# Patient Record
Sex: Female | Born: 1990 | Race: White | Hispanic: No | Marital: Single | Smoking: Never smoker
Health system: Southern US, Community
[De-identification: ages and names within clinical notes are randomized; demographics above are authoritative.]

## PROBLEM LIST (undated history)

## (undated) DIAGNOSIS — F419 Anxiety disorder, unspecified: Secondary | ICD-10-CM

---

## 2019-01-23 ENCOUNTER — Emergency Department (HOSPITAL_COMMUNITY): Payer: Self-pay

## 2019-01-23 ENCOUNTER — Observation Stay (HOSPITAL_COMMUNITY)
Admission: EM | Admit: 2019-01-23 | Discharge: 2019-01-25 | Disposition: A | Discharge status: Home / Self Care | Payer: Self-pay | Attending: Surgery | Admitting: Surgery

## 2019-01-23 ENCOUNTER — Encounter (HOSPITAL_COMMUNITY): Payer: Self-pay | Admitting: *Deleted

## 2019-01-23 ENCOUNTER — Other Ambulatory Visit: Payer: Self-pay

## 2019-01-23 DIAGNOSIS — K810 Acute cholecystitis: Secondary | ICD-10-CM | POA: Diagnosis present

## 2019-01-23 DIAGNOSIS — F419 Anxiety disorder, unspecified: Secondary | ICD-10-CM | POA: Insufficient documentation

## 2019-01-23 DIAGNOSIS — K819 Cholecystitis, unspecified: Secondary | ICD-10-CM

## 2019-01-23 DIAGNOSIS — Z419 Encounter for procedure for purposes other than remedying health state, unspecified: Secondary | ICD-10-CM

## 2019-01-23 DIAGNOSIS — K8000 Calculus of gallbladder with acute cholecystitis without obstruction: Principal | ICD-10-CM | POA: Insufficient documentation

## 2019-01-23 DIAGNOSIS — R109 Unspecified abdominal pain: Secondary | ICD-10-CM

## 2019-01-23 HISTORY — DX: Anxiety disorder, unspecified: F41.9

## 2019-01-23 LAB — COMPREHENSIVE METABOLIC PANEL
ALT: 16 U/L (ref 0–44)
AST: 21 U/L (ref 15–41)
Albumin: 3.9 g/dL (ref 3.5–5.0)
Alkaline Phosphatase: 59 U/L (ref 38–126)
Anion gap: 8 (ref 5–15)
BUN: 7 mg/dL (ref 6–20)
CO2: 24 mmol/L (ref 22–32)
Calcium: 9.1 mg/dL (ref 8.9–10.3)
Chloride: 108 mmol/L (ref 98–111)
Creatinine, Ser: 0.87 mg/dL (ref 0.44–1.00)
GFR calc Af Amer: >60 mL/min (ref >60)
GFR calc non Af Amer: >60 mL/min (ref >60)
Glucose, Bld: 131 mg/dL — ABNORMAL HIGH (ref 70–99)
Potassium: 4 mmol/L (ref 3.5–5.1)
Sodium: 140 mmol/L (ref 135–145)
Total Bilirubin: 0.8 mg/dL (ref 0.3–1.2)
Total Protein: 7.6 g/dL (ref 6.5–8.1)

## 2019-01-23 LAB — CBC
HCT: 41.6 % (ref 36.0–46.0)
Hemoglobin: 13.9 g/dL (ref 12.0–15.0)
MCH: 30.1 pg (ref 26.0–34.0)
MCHC: 33.4 g/dL (ref 30.0–36.0)
MCV: 90 fL (ref 80.0–100.0)
Platelets: 284 10*3/uL (ref 150–400)
RBC: 4.62 MIL/uL (ref 3.87–5.11)
RDW: 12 % (ref 11.5–15.5)
WBC: 12 10*3/uL — ABNORMAL HIGH (ref 4.0–10.5)
nRBC: 0 % (ref 0.0–0.2)

## 2019-01-23 LAB — URINALYSIS, ROUTINE W REFLEX MICROSCOPIC
Bilirubin Urine: NEGATIVE
Glucose, UA: NEGATIVE mg/dL
Ketones, ur: NEGATIVE mg/dL
Leukocytes,Ua: NEGATIVE
Nitrite: NEGATIVE
Protein, ur: NEGATIVE mg/dL
Specific Gravity, Urine: 1.017 (ref 1.005–1.030)
pH: 8 (ref 5.0–8.0)

## 2019-01-23 LAB — CBC WITH DIFFERENTIAL/PLATELET
Abs Immature Granulocytes: 0.06 10*3/uL (ref 0.00–0.07)
Basophils Absolute: 0 10*3/uL (ref 0.0–0.1)
Basophils Relative: 0 %
Eosinophils Absolute: 0 10*3/uL (ref 0.0–0.5)
Eosinophils Relative: 0 %
HCT: 40.7 % (ref 36.0–46.0)
Hemoglobin: 13.6 g/dL (ref 12.0–15.0)
Immature Granulocytes: 1 %
Lymphocytes Relative: 8 %
Lymphs Abs: 1 10*3/uL (ref 0.7–4.0)
MCH: 30.3 pg (ref 26.0–34.0)
MCHC: 33.4 g/dL (ref 30.0–36.0)
MCV: 90.6 fL (ref 80.0–100.0)
Monocytes Absolute: 0.4 10*3/uL (ref 0.1–1.0)
Monocytes Relative: 3 %
Neutro Abs: 10.5 10*3/uL — ABNORMAL HIGH (ref 1.7–7.7)
Neutrophils Relative %: 88 %
Platelets: 283 10*3/uL (ref 150–400)
RBC: 4.49 MIL/uL (ref 3.87–5.11)
RDW: 11.9 % (ref 11.5–15.5)
WBC: 12 10*3/uL — ABNORMAL HIGH (ref 4.0–10.5)
nRBC: 0 % (ref 0.0–0.2)

## 2019-01-23 LAB — CREATININE, SERUM
Creatinine, Ser: 0.78 mg/dL (ref 0.44–1.00)
GFR calc Af Amer: >60 mL/min (ref >60)
GFR calc non Af Amer: >60 mL/min (ref >60)

## 2019-01-23 LAB — I-STAT BETA HCG BLOOD, ED (MC, WL, AP ONLY): I-stat hCG, quantitative: <5 m[IU]/mL (ref <5)

## 2019-01-23 LAB — LIPASE, BLOOD: Lipase: 26 U/L (ref 11–51)

## 2019-01-23 LAB — SURGICAL PCR SCREEN
MRSA, PCR: NEGATIVE
Staphylococcus aureus: NEGATIVE

## 2019-01-23 MED ORDER — ACETAMINOPHEN 650 MG RE SUPP: 650.0000 mg | Freq: Four times a day (QID) | RECTAL | Status: DC | PRN

## 2019-01-23 MED ORDER — OXYCODONE HCL 5 MG PO TABS
5.0000 mg | ORAL_TABLET | ORAL | Status: DC | PRN
  Administered 2019-01-23 – 2019-01-25 (×3): 10 mg via ORAL
  Filled 2019-01-23 (×3): qty 2

## 2019-01-23 MED ORDER — KETOROLAC TROMETHAMINE 30 MG/ML IJ SOLN: 30.0000 mg | Freq: Four times a day (QID) | INTRAMUSCULAR | Status: DC | PRN

## 2019-01-23 MED ORDER — SODIUM CHLORIDE 0.9 % IV SOLN
2.0000 g | INTRAVENOUS | Status: DC
  Administered 2019-01-24: 2 g via INTRAVENOUS
  Filled 2019-01-23 (×2): qty 20

## 2019-01-23 MED ORDER — ALPRAZOLAM 0.25 MG PO TABS: 0.2500 mg | ORAL_TABLET | Freq: Two times a day (BID) | ORAL | Status: DC | PRN

## 2019-01-23 MED ORDER — SODIUM CHLORIDE 0.9 % IV SOLN
2.0000 g | Freq: Once | INTRAVENOUS | Status: AC
  Administered 2019-01-23: 16:00:00 2 g via INTRAVENOUS
  Filled 2019-01-23: qty 20

## 2019-01-23 MED ORDER — ESCITALOPRAM OXALATE 10 MG PO TABS
10.0000 mg | ORAL_TABLET | Freq: Every day | ORAL | Status: DC
  Administered 2019-01-23 – 2019-01-25 (×2): 10 mg via ORAL
  Filled 2019-01-23 (×2): qty 1

## 2019-01-23 MED ORDER — HYDROMORPHONE HCL 1 MG/ML IJ SOLN
0.5000 mg | Freq: Once | INTRAMUSCULAR | Status: AC
  Administered 2019-01-23: 0.5 mg via INTRAVENOUS
  Filled 2019-01-23: qty 1

## 2019-01-23 MED ORDER — ONDANSETRON HCL 4 MG/2ML IJ SOLN: 4.0000 mg | Freq: Four times a day (QID) | INTRAMUSCULAR | Status: DC | PRN

## 2019-01-23 MED ORDER — ONDANSETRON HCL 4 MG/2ML IJ SOLN
4.0000 mg | Freq: Once | INTRAMUSCULAR | Status: AC
  Administered 2019-01-23: 4 mg via INTRAVENOUS
  Filled 2019-01-23: qty 2

## 2019-01-23 MED ORDER — ONDANSETRON 4 MG PO TBDP: 4.0000 mg | ORAL_TABLET | Freq: Four times a day (QID) | ORAL | Status: DC | PRN

## 2019-01-23 MED ORDER — ACETAMINOPHEN 325 MG PO TABS: 650.0000 mg | ORAL_TABLET | Freq: Four times a day (QID) | ORAL | Status: DC | PRN

## 2019-01-23 MED ORDER — DIPHENHYDRAMINE HCL 50 MG/ML IJ SOLN: 25.0000 mg | Freq: Four times a day (QID) | INTRAMUSCULAR | Status: DC | PRN

## 2019-01-23 MED ORDER — SODIUM CHLORIDE 0.9 % IV SOLN
INTRAVENOUS | Status: DC
  Administered 2019-01-23 (×2): via INTRAVENOUS

## 2019-01-23 MED ORDER — MORPHINE SULFATE (PF) 4 MG/ML IV SOLN
4.0000 mg | Freq: Once | INTRAVENOUS | Status: AC
  Administered 2019-01-23: 4 mg via INTRAVENOUS
  Filled 2019-01-23: qty 1

## 2019-01-23 MED ORDER — DIPHENHYDRAMINE HCL 25 MG PO CAPS: 25.0000 mg | ORAL_CAPSULE | Freq: Four times a day (QID) | ORAL | Status: DC | PRN

## 2019-01-23 MED ORDER — ENOXAPARIN SODIUM 40 MG/0.4ML ~~LOC~~ SOLN
40.0000 mg | SUBCUTANEOUS | Status: DC
  Administered 2019-01-24: 40 mg via SUBCUTANEOUS
  Filled 2019-01-23: qty 0.4

## 2019-01-23 MED ORDER — SODIUM CHLORIDE 0.9 % IV BOLUS
1000.0000 mL | Freq: Once | INTRAVENOUS | Status: AC
  Administered 2019-01-23: 1000 mL via INTRAVENOUS

## 2019-01-23 MED ORDER — METOPROLOL TARTRATE 5 MG/5ML IV SOLN: 5.0000 mg | Freq: Four times a day (QID) | INTRAVENOUS | Status: DC | PRN

## 2019-01-23 MED ORDER — HYDROMORPHONE HCL 1 MG/ML IJ SOLN: 1.0000 mg | INTRAMUSCULAR | Status: DC | PRN

## 2019-01-23 MED ORDER — IOHEXOL 300 MG/ML  SOLN
100.0000 mL | Freq: Once | INTRAMUSCULAR | Status: AC | PRN
  Administered 2019-01-23: 100 mL via INTRAVENOUS

## 2019-01-23 MED ORDER — HYDROMORPHONE HCL 1 MG/ML IJ SOLN
1.0000 mg | Freq: Once | INTRAMUSCULAR | Status: AC
  Administered 2019-01-23: 1 mg via INTRAVENOUS
  Filled 2019-01-23: qty 1

## 2019-01-23 NOTE — ED Provider Notes (Signed)
[facility] EMERGENCY DEPARTMENT Provider Note   CSN: [id number] Arrival date & time: 01/23/19  1115  History   Chief Complaint Abdominal pain  HPI Beth [name] is a 28 y.o. female with past medical history who presents for evaluation of abdominal pain.  Patient states she woke at 5 AM this morning with gradually worsening, severe abdominal pain.  Pain is located to her periumbilical area.  Rates her pain a 10/10.  States pain is been constant onset.  She denies prior abdominal surgeries.  Patient states she has had persistent nausea as well as one episode of nonbloody, nonbilious emesis.  She denies any suspicious food intake, known sick contacts, antibiotic use.  Patient has not taken anything for her pain.  Denies fever, chills, neck pain, neck stiffness, headache, cough, chest pain, shortness of breath, diarrhea, dysuria, constipation, pelvic pain, vaginal bleeding, vaginal discharge.  LMP 01/23/2019.  Patient denies chance of current pregnancy.  Describes her pain as a aching and tightening sensation.  Denies additional aggravating or alleviating factors. Denies radiation of pain into back or shoulder. No NSAID use, tobacco or alcohol use. Patient states that does have a history of gallstones, however that pain "feels different." Patient was seen by UC earlier today and referred to ED for labs and imaging.  Prior ABD surgery-- None Last PO intake 10 pm yesterday  History obtained from patient.  No interpreter was used.     HPI  Past Medical History:  Diagnosis Date  . Anxiety     There are no active problems to display for this patient.   History reviewed. No pertinent surgical history.   OB History   No obstetric history on file.      Home Medications    Prior to Admission medications   Medication Sig Start Date End Date Taking? Authorizing Provider  escitalopram (LEXAPRO) 10 MG tablet Take 10 mg by mouth daily. 01/03/19   [provider]     Family History History reviewed. No pertinent family history.  Social History Social History   Tobacco Use  . Smoking status: Never Smoker  . Smokeless tobacco: Never Used  . Tobacco comment: [place]  Substance Use Topics  . Alcohol use: Yes    Comment: social  . Drug use: Never     Allergies   Patient has no known allergies.   Review of Systems Review of Systems  Constitutional: Negative.   HENT: Negative.   Respiratory: Negative.   Cardiovascular: Negative.   Gastrointestinal: Positive for abdominal pain, nausea and vomiting. Negative for anal bleeding, blood in stool, constipation, diarrhea and rectal pain.  Genitourinary: Negative.   Musculoskeletal: Negative.   Neurological: Negative.   All other systems reviewed and are negative.    Physical Exam Updated Vital Signs BP 140/82 (BP Location: Right Arm)   Pulse 67   Temp 98.5 F (36.9 C) (Oral)   Resp 16   Ht  (1.803 m)   LMP 01/23/2019   SpO2 100%   Physical Exam Vitals signs and nursing note reviewed.  Constitutional:      General: She is not in acute distress.    Appearance: She is well-developed. She is not ill-appearing, toxic-appearing or diaphoretic.  HENT:     Head: Normocephalic and atraumatic.     Mouth/Throat:     Comments: Posterior oropharynx clear.  Mucous membranes moist. Eyes:     Pupils: Pupils are equal, round, and reactive to light.     Comments: No scleral  icterus.  Pupils equals equal reactive to light.  Neck:     Musculoskeletal: Normal range of motion.  Cardiovascular:     Rate and Rhythm: Normal rate.     Heart sounds: Normal heart sounds.  Pulmonary:     Effort: No respiratory distress.     Comments: Clear to auscultation bilaterally without wheeze, rhonchi or rales.  No accessory muscle usage.  Speaking full sentences without difficulty. Abdominal:     General: There is no distension.     Comments: Soft without rebound.  She has generalized tenderness palpation with  guarding. She has normoactive bowel sounds.  Appears in discomfort.  Negative CVA tap bilaterally. Positive Murphy sign on reevaluation. She has no abdominal wall skin changes or obvious herniations.  Musculoskeletal: Normal range of motion.     Comments: Moves all 4 extremities without difficulty.  2+ radial as well as 2+ DP pulses bilaterally.  Skin:    General: Skin is warm and dry.     Comments: No edema, erythema, ecchymosis or warmth.  No jaundice to skin.  Brisk capillary refill.  Neurological:     Mental Status: She is alert.     Comments: Cranial nerves II through XII grossly intact.  No dysphasia.  Ambulatory in department without difficulty.    ED Treatments / Results  Labs (all labs ordered are listed, but only abnormal results are displayed) Labs Reviewed  CBC WITH DIFFERENTIAL/PLATELET - Abnormal; Notable for the following components:      Result Value   WBC 12.0 (*)    Neutro Abs 10.5 (*)    All other components within normal limits  COMPREHENSIVE METABOLIC PANEL - Abnormal; Notable for the following components:   Glucose, Bld 131 (*)    All other components within normal limits  URINALYSIS, ROUTINE W REFLEX MICROSCOPIC - Abnormal; Notable for the following components:   APPearance CLOUDY (*)    Hgb urine dipstick SMALL (*)    Bacteria, UA RARE (*)    All other components within normal limits  URINE CULTURE  LIPASE, BLOOD  I-STAT BETA HCG BLOOD, ED ([facility], [facility], [facility] ONLY)    EKG None  Radiology Ct Abdomen Pelvis W Contrast  Result Date: 01/23/2019 CLINICAL DATA:  Abdominal pain EXAM: CT ABDOMEN AND PELVIS WITH CONTRAST TECHNIQUE: Multidetector CT imaging of the abdomen and pelvis was performed using the standard protocol following bolus administration of intravenous contrast. CONTRAST:  OMNIPAQUE IOHEXOL 300 MG/ML  SOLN COMPARISON:  None. FINDINGS: Lower chest: On axial slice 1 series 4, there is a 2 mm nodular opacity in the right middle lobe. Lung bases  otherwise are clear. Hepatobiliary: No focal liver lesions are appreciable. There are nitrogen containing gallstones in the gallbladder. The gallbladder wall appears mildly edematous by CT. There is no biliary duct dilatation. Pancreas: There is no pancreatic mass or inflammatory focus. Spleen: No splenic lesions are evident. Adrenals/Urinary Tract: Adrenals bilaterally appear normal. Kidneys bilaterally show no evident mass or hydronephrosis on either side. There is no renal or ureteral calculus on either side. Urinary bladder is midline with wall thickness within normal limits. Stomach/Bowel: There is no appreciable bowel wall or mesenteric thickening. There is no appreciable bowel obstruction. There is no free air or portal venous air. Vascular/Lymphatic: There is no abdominal aortic aneurysm. No vascular lesions are evident. There is no adenopathy in the abdomen or pelvis. Reproductive: The uterus is anteverted. There is no evident pelvic mass. Other: The appendix appears normal. There is no abscess  or ascites in the abdomen or pelvis. Musculoskeletal: No blastic or lytic bone lesions are evident. There is no intramuscular or abdominal wall lesion evident. IMPRESSION: 1. Nitrogen containing gallstones in the gallbladder. The gallbladder wall appears mildly edematous by CT. Advise correlation with ultrasound of the gallbladder to further assess for potential changes of early acute cholecystitis. 2. No evident bowel obstruction. No abscess in the abdomen or pelvis. Appendix appears normal. 3. No renal or ureteral calculus. No hydronephrosis. Urinary bladder wall thickness is normal. 4. 2 mm nodular opacity in the right middle lobe. This finding is almost certainly benign in this age group. Unless patient has a history of underlying neoplasm, no further imaging of this small nodular lesion is felt to be warranted. Electronically Signed   By: Bretta Bang III M.D.   On: 01/23/2019 13:51    US Abdomen Limited  Ruq  Result Date: 01/23/2019 CLINICAL DATA:  Upper abdominal pain EXAM: ULTRASOUND ABDOMEN LIMITED RIGHT UPPER QUADRANT COMPARISON:  CT abdomen and pelvis January 23, 2019 FINDINGS: Gallbladder: Within the gallbladder, there are echogenic foci which move and shadow consistent with cholelithiasis. Largest gallstone measures 1.8 cm in length. There is thickening of the gallbladder wall with subtle wall edema. No pericholecystic fluid. Patient is focally tender over the gallbladder. Common bile duct: Diameter: 3 mm. No intrahepatic or extrahepatic biliary duct dilatation. Liver: No focal lesion identified. Within normal limits in parenchymal echogenicity. Portal vein is patent on color Doppler imaging with normal direction of blood flow towards the liver. IMPRESSION: Cholelithiasis. Gallbladder wall is mildly thickened and subtly edematous. Patient is tender focally over the gallbladder. These are findings concerning for a degree of acute cholecystitis. Study otherwise unremarkable. Electronically Signed   By: Bretta Bang III M.D.   On: 01/23/2019 14:44   Procedures Procedures (including critical care time)  Medications Ordered in ED Medications  cefTRIAXone (ROCEPHIN) 2 g in sodium chloride 0.9 % 100 mL IVPB (has no administration in time range)  sodium chloride 0.9 % bolus 1,000 mL (1,000 mLs Intravenous New Bag/Given 01/23/19 1144)  ondansetron (ZOFRAN) injection 4 mg (4 mg Intravenous Given 01/23/19 1143)  morphine 4 MG/ML injection 4 mg (4 mg Intravenous Given 01/23/19 1145)  HYDROmorphone (DILAUDID) injection 1 mg (1 mg Intravenous Given 01/23/19 1322)  iohexol (OMNIPAQUE) 300 MG/ML solution 100 mL (100 mLs Intravenous Contrast Given 01/23/19 1332)   Initial Impression / Assessment and Plan / ED Course  I have reviewed the triage vital signs and the nursing notes.  Pertinent labs & imaging results that were available during my care of the patient were reviewed by me and considered in my medical  decision making (see chart for details).  28 year old female appears otherwise well presents for evaluation of abdominal pain.  Afebrile, nonseptic, non-ill-appearing.  Patient with gradual onset worsening generalized abdominal pain worse around periumbilical region.  Pain began at 5 AM, 6 hours PTA.  Has not taking anything for her symptoms.  Persistent nausea as well as one episode of nonbloody, nonbilious emesis.  She has been having normal bowel movements.  No urinary symptoms.  Denies concerns for STDs, no pelvic pain or vaginal discharge.  LMP 01/23/2019.  Suspicious food intake, sick contacts or recent antibiotic use.  Her abdominal pain does not radiate into her shoulder or into her back.  Not exacerbated with food intake.  Is not anything like this previously.  On exam abdomen soft without rebound.  She does have generalized tenderness to palpation with guarding.  Normoactive bowel sounds.  No abdominal wall skin changes or obvious herniations.  Membranes moist.  Lungs clear.  Will obtain labs, IV fluids, urinalysis, antiemetics, pain medication, imaging and reevaluate.  1310: Patient requesting additional pain medication. Only mild relief with Morphine. Will order Dilaudid.  Labs and imaging personally reviewed:  CBC with mild leukocytosis at 12.0, hemoglobin 13.6 Metabolic panel with mild hyperglycemia at 131, no additional electrolyte, renal or liver abnormalities Urinalysis negative for infection she does have blood in her urine, however she her cycle, will culture HCG negative Lipase:26  1415: Patient with no current pain over tender over gallbladder. Sleeping on exam arousable by voice. CT with Nitrogen containing gallstones in the gallbladder. The gallbladder wall appears mildly edematous by CT. Advise correlation with ultrasound of the gallbladder to further assess for potential changes of early acute cholecystitis. Will order US and reevaluate.  US with gallbladder wall thickening and  subtle edema and tenderness over gallbladder suggestive of degree of cholecystitis. Will give Rocephin and consult with general surgery.  1450: General surgery consult pending. ABX ordered.  1500: Consulted with Wells GuilesKelly Rayburn PA with general surgery who will evaluate patient in ED for admission. Patient hemodynamically stable. I discussed plan with patient.  Patient agreement with plan.      Final Clinical Impressions(s) / ED Diagnoses   Final diagnoses:  Abdominal pain  Cholecystitis    ED Discharge Orders    None       Ceceilia Cephus A, PA-C 01/23/19 1517    Tilden Fossaees, Elizabeth, MD 01/24/19 [phone]

## 2019-01-23 NOTE — ED Notes (Signed)
Dr Margaree Mackintosh in to speak to pt

## 2019-01-23 NOTE — Plan of Care (Signed)
  Problem: Pain Managment: Goal: General experience of comfort will improve Outcome: Progressing   

## 2019-01-23 NOTE — ED Notes (Signed)
Pt back form ultrasound.

## 2019-01-23 NOTE — H&P (Signed)
[facility] Admission Note  Beth [name] 09-17-1991  [id number].    Requesting MD: Madilyn Hook Chief Complaint/Reason for Consult: cholecystitis HPI:  Patient is an otherwise healthy 28 year old female with known gallstones who presented to [facility] today with abdominal pain. Patient reports pain woke her from sleep this AM and has been more severe than past gallbladder attacks. Pain primarily located in epigastric region of abdomen. Associated nausea with bilious emesis. Patient denies fever, chills, chest pain, SOB, diarrhea, constipation, urinary symptoms. Patient takes medication for anxiety daily, but no blood thinning medications. Patient has never had any surgery. Occasional alcohol use, smokes hookah, denies illicit drug use.   ROS: Review of Systems  Constitutional: Negative for chills and fever.  Respiratory: Negative for shortness of breath and wheezing.   Cardiovascular: Negative for chest pain and palpitations.  Gastrointestinal: Positive for abdominal pain, nausea and vomiting. Negative for blood in stool, constipation, diarrhea and melena.  Genitourinary: Negative for dysuria, frequency and urgency.  Psychiatric/Behavioral: The patient is nervous/anxious.   All other systems reviewed and are negative.   History reviewed. No pertinent family history.  Past Medical History:  Diagnosis Date  . Anxiety     History reviewed. No pertinent surgical history.  Social History:  reports that she has never smoked. She has never used smokeless tobacco. She reports current alcohol use. She reports that she does not use drugs.  Allergies: No Known Allergies  (Not in a hospital admission)   Blood pressure 140/82, pulse 67, temperature 98.5 F (36.9 C), temperature source Oral, resp. rate 16, height  (1.803 m), last menstrual period 01/23/2019, SpO2 100 %. Physical Exam: Physical Exam Constitutional:      General: She is not in acute distress.    Appearance: She is  well-developed and overweight. She is not toxic-appearing.  HENT:     Head: Normocephalic and atraumatic.     Right Ear: External ear normal.     Left Ear: External ear normal.     Nose: Nose normal.     Mouth/Throat:     Lips: Pink.     Mouth: Mucous membranes are moist.  Eyes:     General: Lids are normal. No scleral icterus. Neck:     Musculoskeletal: Normal range of motion and neck supple.  Cardiovascular:     Rate and Rhythm: Normal rate and regular rhythm.     Pulses:          Radial pulses are 2+ on the right side and 2+ on the left side.  Pulmonary:     Effort: Pulmonary effort is normal.     Breath sounds: Normal breath sounds.  Abdominal:     General: Bowel sounds are normal. There is no distension.     Palpations: Abdomen is soft. There is no hepatomegaly or splenomegaly.     Tenderness: There is abdominal tenderness in the right upper quadrant and epigastric area. There is no guarding or rebound. Positive signs include Murphy's sign.  Musculoskeletal:     Comments: ROM grossly intact in all 4 extremities  Skin:    General: Skin is warm and dry.     Coloration: Skin is not jaundiced.  Neurological:     Mental Status: She is alert and oriented to person, place, and time.  Psychiatric:        Attention and Perception: Attention and perception normal.        Mood and Affect: Mood is anxious.  Speech: Speech normal.        Behavior: Behavior normal. Behavior is cooperative.     Results for orders placed or performed during the hospital encounter of 01/23/19 (from the past 48 hour(s))  CBC with Differential     Status: Abnormal   Collection Time: 01/23/19 11:26 AM  Result Value Ref Range   WBC 12.0 (H) 4.0 - 10.5 K/uL   RBC 4.49 3.87 - 5.11 MIL/uL   Hemoglobin 13.6 12.0 - 15.0 g/dL   HCT 69.640.7 29.536.0 - 28.446.0 %   MCV 90.6 80.0 - 100.0 fL   MCH 30.3 26.0 - 34.0 pg   MCHC 33.4 30.0 - 36.0 g/dL   RDW 13.211.9 44.011.5 - 10.215.5 %   Platelets 283 150 - 400 K/uL   nRBC 0.0  0.0 - 0.2 %   Neutrophils Relative % 88 %   Neutro Abs 10.5 (H) 1.7 - 7.7 K/uL   Lymphocytes Relative 8 %   Lymphs Abs 1.0 0.7 - 4.0 K/uL   Monocytes Relative 3 %   Monocytes Absolute 0.4 0.1 - 1.0 K/uL   Eosinophils Relative 0 %   Eosinophils Absolute 0.0 0.0 - 0.5 K/uL   Basophils Relative 0 %   Basophils Absolute 0.0 0.0 - 0.1 K/uL   Immature Granulocytes 1 %   Abs Immature Granulocytes 0.06 0.00 - 0.07 K/uL    Comment: Performed at [facility], [address] [place], [place] [address]  Comprehensive metabolic panel     Status: Abnormal   Collection Time: 01/23/19 11:26 AM  Result Value Ref Range   Sodium 140 135 - 145 mmol/L   Potassium 4.0 3.5 - 5.1 mmol/L   Chloride 108 98 - 111 mmol/L   CO2 24 22 - 32 mmol/L   Glucose, Bld 131 (H) 70 - 99 mg/dL   BUN 7 6 - 20 mg/dL   Creatinine, Ser 6.440.87 0.44 - 1.00 mg/dL   Calcium 9.1 8.9 - 03.410.3 mg/dL   Total Protein 7.6 6.5 - 8.1 g/dL   Albumin 3.9 3.5 - 5.0 g/dL   AST 21 15 - 41 U/L   ALT 16 0 - 44 U/L   Alkaline Phosphatase 59 38 - 126 U/L   Total Bilirubin 0.8 0.3 - 1.2 mg/dL   GFR calc non Af Amer >60 >60 mL/min   GFR calc Af Amer >60 >60 mL/min   Anion gap 8 5 - 15    Comment: Performed at [facility], [address] [place], [place] [address]  Lipase, blood     Status: None   Collection Time: 01/23/19 11:26 AM  Result Value Ref Range   Lipase 26 11 - 51 U/L    Comment: Performed at [facility], [address] [place], [place] [address]  Urinalysis, Routine w reflex microscopic     Status: Abnormal   Collection Time: 01/23/19 11:26 AM  Result Value Ref Range   Color, Urine YELLOW YELLOW   APPearance CLOUDY (A) CLEAR   Specific Gravity, Urine 1.017 1.005 - 1.030   pH 8.0 5.0 - 8.0   Glucose, UA NEGATIVE NEGATIVE mg/dL   Hgb urine dipstick SMALL (A) NEGATIVE   Bilirubin Urine NEGATIVE NEGATIVE   Ketones, ur NEGATIVE NEGATIVE mg/dL   Protein, ur NEGATIVE NEGATIVE mg/dL   Nitrite NEGATIVE NEGATIVE    Leukocytes,Ua NEGATIVE NEGATIVE   RBC / HPF 0-5 0 - 5 RBC/hpf   WBC, UA 0-5 0 - 5 WBC/hpf  Bacteria, UA RARE (A) NONE SEEN   Squamous Epithelial / LPF 0-5 0 - 5   Mucus PRESENT     Comment: Performed at [facility], [address] [place], [place] [address]  I-Stat Beta hCG blood, ED ([facility], [facility], [facility] only)     Status: None   Collection Time: 01/23/19 12:03 PM  Result Value Ref Range   I-stat hCG, quantitative <5.0 <5 mIU/mL   Comment 3            Comment:   GEST. AGE      CONC.  (mIU/mL)   <=1 WEEK        5 - 50     2 WEEKS       50 - 500     3 WEEKS       100 - 10,000     4 WEEKS     1,000 - 30,000        FEMALE AND NON-PREGNANT FEMALE:     LESS THAN 5 mIU/mL    Ct Abdomen Pelvis W Contrast  Result Date: 01/23/2019 CLINICAL DATA:  Abdominal pain EXAM: CT ABDOMEN AND PELVIS WITH CONTRAST TECHNIQUE: Multidetector CT imaging of the abdomen and pelvis was performed using the standard protocol following bolus administration of intravenous contrast. CONTRAST:  OMNIPAQUE IOHEXOL 300 MG/ML  SOLN COMPARISON:  None. FINDINGS: Lower chest: On axial slice 1 series 4, there is a 2 mm nodular opacity in the right middle lobe. Lung bases otherwise are clear. Hepatobiliary: No focal liver lesions are appreciable. There are nitrogen containing gallstones in the gallbladder. The gallbladder wall appears mildly edematous by CT. There is no biliary duct dilatation. Pancreas: There is no pancreatic mass or inflammatory focus. Spleen: No splenic lesions are evident. Adrenals/Urinary Tract: Adrenals bilaterally appear normal. Kidneys bilaterally show no evident mass or hydronephrosis on either side. There is no renal or ureteral calculus on either side. Urinary bladder is midline with wall thickness within normal limits. Stomach/Bowel: There is no appreciable bowel wall or mesenteric thickening. There is no appreciable bowel obstruction. There is no free air or portal venous air. Vascular/Lymphatic:  There is no abdominal aortic aneurysm. No vascular lesions are evident. There is no adenopathy in the abdomen or pelvis. Reproductive: The uterus is anteverted. There is no evident pelvic mass. Other: The appendix appears normal. There is no abscess or ascites in the abdomen or pelvis. Musculoskeletal: No blastic or lytic bone lesions are evident. There is no intramuscular or abdominal wall lesion evident. IMPRESSION: 1. Nitrogen containing gallstones in the gallbladder. The gallbladder wall appears mildly edematous by CT. Advise correlation with ultrasound of the gallbladder to further assess for potential changes of early acute cholecystitis. 2. No evident bowel obstruction. No abscess in the abdomen or pelvis. Appendix appears normal. 3. No renal or ureteral calculus. No hydronephrosis. Urinary bladder wall thickness is normal. 4. 2 mm nodular opacity in the right middle lobe. This finding is almost certainly benign in this age group. Unless patient has a history of underlying neoplasm, no further imaging of this small nodular lesion is felt to be warranted. Electronically Signed   By: Bretta Bang III M.D.   On: 01/23/2019 13:51   US Abdomen Limited Ruq  Result Date: 01/23/2019 CLINICAL DATA:  Upper abdominal pain EXAM: ULTRASOUND ABDOMEN LIMITED RIGHT UPPER QUADRANT COMPARISON:  CT abdomen and pelvis January 23, 2019 FINDINGS: Gallbladder: Within the gallbladder, there are echogenic foci which move and shadow consistent with cholelithiasis.  Largest gallstone measures 1.8 cm in length. There is thickening of the gallbladder wall with subtle wall edema. No pericholecystic fluid. Patient is focally tender over the gallbladder. Common bile duct: Diameter: 3 mm. No intrahepatic or extrahepatic biliary duct dilatation. Liver: No focal lesion identified. Within normal limits in parenchymal echogenicity. Portal vein is patent on color Doppler imaging with normal direction of blood flow towards the liver.  IMPRESSION: Cholelithiasis. Gallbladder wall is mildly thickened and subtly edematous. Patient is tender focally over the gallbladder. These are findings concerning for a degree of acute cholecystitis. Study otherwise unremarkable. Electronically Signed   By: Bretta Bang III M.D.   On: 01/23/2019 14:44      Assessment/Plan Acute Cholecystitis - admit to floor - IVF and IV abx - ok to have CLD, NPO after MN - plan lap chole tomorrow AM  Wells Guiles, [facility] 01/23/2019, 3:35 PM Pager: [phone] Consults: [phone]

## 2019-01-23 NOTE — ED Triage Notes (Signed)
Pt reports ABD woke her up this morning. Pain per pt. 10/10 . Pt reports nausea  No vomiting.

## 2019-01-24 ENCOUNTER — Observation Stay (HOSPITAL_COMMUNITY): Payer: Self-pay

## 2019-01-24 ENCOUNTER — Observation Stay (HOSPITAL_COMMUNITY): Payer: Self-pay | Admitting: Anesthesiology

## 2019-01-24 ENCOUNTER — Encounter (HOSPITAL_COMMUNITY)
Admission: EM | Disposition: A | Discharge status: Home / Self Care | Payer: Self-pay | Source: Home / Self Care | Attending: Emergency Medicine

## 2019-01-24 HISTORY — PX: CHOLECYSTECTOMY: SHX55

## 2019-01-24 LAB — CBC
HCT: 37.9 % (ref 36.0–46.0)
Hemoglobin: 12.6 g/dL (ref 12.0–15.0)
MCH: 30.1 pg (ref 26.0–34.0)
MCHC: 33.2 g/dL (ref 30.0–36.0)
MCV: 90.5 fL (ref 80.0–100.0)
Platelets: 249 10*3/uL (ref 150–400)
RBC: 4.19 MIL/uL (ref 3.87–5.11)
RDW: 12.1 % (ref 11.5–15.5)
WBC: 9 10*3/uL (ref 4.0–10.5)
nRBC: 0 % (ref 0.0–0.2)

## 2019-01-24 LAB — COMPREHENSIVE METABOLIC PANEL
ALT: 37 U/L (ref 0–44)
AST: 49 U/L — ABNORMAL HIGH (ref 15–41)
Albumin: 3.2 g/dL — ABNORMAL LOW (ref 3.5–5.0)
Alkaline Phosphatase: 54 U/L (ref 38–126)
Anion gap: 9 (ref 5–15)
BUN: 7 mg/dL (ref 6–20)
CO2: 21 mmol/L — ABNORMAL LOW (ref 22–32)
Calcium: 8.4 mg/dL — ABNORMAL LOW (ref 8.9–10.3)
Chloride: 108 mmol/L (ref 98–111)
Creatinine, Ser: 0.81 mg/dL (ref 0.44–1.00)
GFR calc Af Amer: >60 mL/min (ref >60)
GFR calc non Af Amer: >60 mL/min (ref >60)
Glucose, Bld: 104 mg/dL — ABNORMAL HIGH (ref 70–99)
Potassium: 3.4 mmol/L — ABNORMAL LOW (ref 3.5–5.1)
Sodium: 138 mmol/L (ref 135–145)
Total Bilirubin: 1.2 mg/dL (ref 0.3–1.2)
Total Protein: 6.7 g/dL (ref 6.5–8.1)

## 2019-01-24 SURGERY — LAPAROSCOPIC CHOLECYSTECTOMY WITH INTRAOPERATIVE CHOLANGIOGRAM
Anesthesia: General | Site: Abdomen | Laterality: Left

## 2019-01-24 MED ORDER — LACTATED RINGERS IV SOLN
INTRAVENOUS | Status: DC
  Administered 2019-01-24: 09:00:00 via INTRAVENOUS

## 2019-01-24 MED ORDER — PROMETHAZINE HCL 25 MG/ML IJ SOLN: 6.2500 mg | INTRAMUSCULAR | Status: DC | PRN

## 2019-01-24 MED ORDER — LIDOCAINE HCL (CARDIAC) PF 100 MG/5ML IV SOSY
PREFILLED_SYRINGE | INTRAVENOUS | Status: DC | PRN
  Administered 2019-01-24: 80 mg via INTRAVENOUS

## 2019-01-24 MED ORDER — ACETAMINOPHEN 10 MG/ML IV SOLN
1000.0000 mg | Freq: Once | INTRAVENOUS | Status: AC
  Administered 2019-01-24: 11:00:00 1000 mg via INTRAVENOUS

## 2019-01-24 MED ORDER — FENTANYL CITRATE (PF) 250 MCG/5ML IJ SOLN
INTRAMUSCULAR | Status: AC
  Filled 2019-01-24: qty 5

## 2019-01-24 MED ORDER — ONDANSETRON HCL 4 MG/2ML IJ SOLN
INTRAMUSCULAR | Status: DC | PRN
  Administered 2019-01-24: 4 mg via INTRAVENOUS

## 2019-01-24 MED ORDER — FENTANYL CITRATE (PF) 250 MCG/5ML IJ SOLN
INTRAMUSCULAR | Status: DC | PRN
  Administered 2019-01-24: 100 ug via INTRAVENOUS
  Administered 2019-01-24 (×5): 50 ug via INTRAVENOUS

## 2019-01-24 MED ORDER — ROCURONIUM BROMIDE 50 MG/5ML IV SOSY
PREFILLED_SYRINGE | INTRAVENOUS | Status: AC
  Filled 2019-01-24: qty 5

## 2019-01-24 MED ORDER — MIDAZOLAM HCL 2 MG/2ML IJ SOLN
INTRAMUSCULAR | Status: AC
  Filled 2019-01-24: qty 2

## 2019-01-24 MED ORDER — DEXAMETHASONE SODIUM PHOSPHATE 10 MG/ML IJ SOLN
INTRAMUSCULAR | Status: DC | PRN
  Administered 2019-01-24: 10 mg via INTRAVENOUS

## 2019-01-24 MED ORDER — SUGAMMADEX SODIUM 200 MG/2ML IV SOLN
INTRAVENOUS | Status: DC | PRN
  Administered 2019-01-24: 200 mg via INTRAVENOUS

## 2019-01-24 MED ORDER — EPHEDRINE 5 MG/ML INJ
INTRAVENOUS | Status: AC
  Filled 2019-01-24: qty 10

## 2019-01-24 MED ORDER — 0.9 % SODIUM CHLORIDE (POUR BTL) OPTIME
TOPICAL | Status: DC | PRN
  Administered 2019-01-24: 10:00:00 1000 mL

## 2019-01-24 MED ORDER — DEXAMETHASONE SODIUM PHOSPHATE 10 MG/ML IJ SOLN
INTRAMUSCULAR | Status: AC
  Filled 2019-01-24: qty 2

## 2019-01-24 MED ORDER — BUPIVACAINE-EPINEPHRINE 0.25% -1:200000 IJ SOLN
INTRAMUSCULAR | Status: DC | PRN
  Administered 2019-01-24: 14 mL

## 2019-01-24 MED ORDER — PROPOFOL 10 MG/ML IV BOLUS
INTRAVENOUS | Status: AC
  Filled 2019-01-24: qty 20

## 2019-01-24 MED ORDER — SUCCINYLCHOLINE CHLORIDE 20 MG/ML IJ SOLN
INTRAMUSCULAR | Status: DC | PRN
  Administered 2019-01-24: 100 mg via INTRAVENOUS

## 2019-01-24 MED ORDER — PHENYLEPHRINE 40 MCG/ML (10ML) SYRINGE FOR IV PUSH (FOR BLOOD PRESSURE SUPPORT)
PREFILLED_SYRINGE | INTRAVENOUS | Status: AC
  Filled 2019-01-24: qty 10

## 2019-01-24 MED ORDER — ROCURONIUM BROMIDE 100 MG/10ML IV SOLN
INTRAVENOUS | Status: DC | PRN
  Administered 2019-01-24: 50 mg via INTRAVENOUS

## 2019-01-24 MED ORDER — ONDANSETRON HCL 4 MG/2ML IJ SOLN
INTRAMUSCULAR | Status: AC
  Filled 2019-01-24: qty 2

## 2019-01-24 MED ORDER — SODIUM CHLORIDE 0.9 % IV SOLN
INTRAVENOUS | Status: DC | PRN
  Administered 2019-01-24: 10:00:00 9 mL

## 2019-01-24 MED ORDER — POTASSIUM CHLORIDE CRYS ER 20 MEQ PO TBCR
40.0000 meq | EXTENDED_RELEASE_TABLET | Freq: Once | ORAL | Status: AC
  Administered 2019-01-24: 40 meq via ORAL
  Filled 2019-01-24: qty 2

## 2019-01-24 MED ORDER — KETOROLAC TROMETHAMINE 30 MG/ML IJ SOLN
INTRAMUSCULAR | Status: AC
  Filled 2019-01-24: qty 1

## 2019-01-24 MED ORDER — LIDOCAINE 2% (20 MG/ML) 5 ML SYRINGE
INTRAMUSCULAR | Status: AC
  Filled 2019-01-24: qty 5

## 2019-01-24 MED ORDER — BUPIVACAINE-EPINEPHRINE (PF) 0.25% -1:200000 IJ SOLN
INTRAMUSCULAR | Status: AC
  Filled 2019-01-24: qty 30

## 2019-01-24 MED ORDER — HYDROMORPHONE HCL 1 MG/ML IJ SOLN
INTRAMUSCULAR | Status: AC
  Filled 2019-01-24: qty 1

## 2019-01-24 MED ORDER — MIDAZOLAM HCL 5 MG/5ML IJ SOLN
INTRAMUSCULAR | Status: DC | PRN
  Administered 2019-01-24: 2 mg via INTRAVENOUS

## 2019-01-24 MED ORDER — FENTANYL CITRATE (PF) 100 MCG/2ML IJ SOLN: 25.0000 ug | INTRAMUSCULAR | Status: DC | PRN

## 2019-01-24 MED ORDER — HYDROMORPHONE HCL 1 MG/ML IJ SOLN
0.2500 mg | INTRAMUSCULAR | Status: DC | PRN
  Administered 2019-01-24: 0.5 mg via INTRAVENOUS

## 2019-01-24 MED ORDER — KETOROLAC TROMETHAMINE 30 MG/ML IJ SOLN
30.0000 mg | Freq: Once | INTRAMUSCULAR | Status: AC
  Administered 2019-01-24: 30 mg via INTRAVENOUS

## 2019-01-24 MED ORDER — SUCCINYLCHOLINE CHLORIDE 200 MG/10ML IV SOSY
PREFILLED_SYRINGE | INTRAVENOUS | Status: AC
  Filled 2019-01-24: qty 10

## 2019-01-24 MED ORDER — ACETAMINOPHEN 10 MG/ML IV SOLN
INTRAVENOUS | Status: AC
  Filled 2019-01-24: qty 100

## 2019-01-24 MED ORDER — SODIUM CHLORIDE 0.9 % IR SOLN
Status: DC | PRN
  Administered 2019-01-24: 1

## 2019-01-24 SURGICAL SUPPLY — 50 items
APPLIER CLIP ROT 10 11.4 M/L (STAPLE) ×3
BENZOIN TINCTURE PRP APPL 2/3 (GAUZE/BANDAGES/DRESSINGS) ×3 IMPLANT
BLADE CLIPPER SURG (BLADE) IMPLANT
CANISTER SUCT 3000ML PPV (MISCELLANEOUS) ×3 IMPLANT
CHLORAPREP W/TINT 26ML (MISCELLANEOUS) ×3 IMPLANT
CLIP APPLIE ROT 10 11.4 M/L (STAPLE) ×1 IMPLANT
CLOSURE WOUND 1/2 X4 (GAUZE/BANDAGES/DRESSINGS) ×1
COVER MAYO STAND STRL (DRAPES) ×3 IMPLANT
COVER SURGICAL LIGHT HANDLE (MISCELLANEOUS) ×3 IMPLANT
COVER WAND RF STERILE (DRAPES) ×3 IMPLANT
DRAPE C-ARM 42X72 X-RAY (DRAPES) ×3 IMPLANT
DRSG TEGADERM 2-3/8X2-3/4 SM (GAUZE/BANDAGES/DRESSINGS) ×9 IMPLANT
DRSG TEGADERM 4X4.75 (GAUZE/BANDAGES/DRESSINGS) ×3 IMPLANT
ELECT REM PT RETURN 9FT ADLT (ELECTROSURGICAL) ×3
ELECTRODE REM PT RTRN 9FT ADLT (ELECTROSURGICAL) ×1 IMPLANT
GAUZE SPONGE 2X2 8PLY STRL LF (GAUZE/BANDAGES/DRESSINGS) ×1 IMPLANT
GLOVE BIO SURGEON STRL SZ7 (GLOVE) ×3 IMPLANT
GLOVE BIOGEL M 7.0 STRL (GLOVE) ×2 IMPLANT
GLOVE BIOGEL PI IND STRL 7.0 (GLOVE) IMPLANT
GLOVE BIOGEL PI IND STRL 7.5 (GLOVE) ×1 IMPLANT
GLOVE BIOGEL PI INDICATOR 7.0 (GLOVE) ×2
GLOVE BIOGEL PI INDICATOR 7.5 (GLOVE) ×2
GOWN STRL REUS W/ TWL LRG LVL3 (GOWN DISPOSABLE) ×3 IMPLANT
GOWN STRL REUS W/TWL LRG LVL3 (GOWN DISPOSABLE) ×6
HEMOSTAT SNOW SURGICEL 2X4 (HEMOSTASIS) ×2 IMPLANT
KIT BASIN OR (CUSTOM PROCEDURE TRAY) ×3 IMPLANT
KIT TURNOVER KIT B (KITS) ×3 IMPLANT
NS IRRIG 1000ML POUR BTL (IV SOLUTION) ×3 IMPLANT
PAD ARMBOARD 7.5X6 YLW CONV (MISCELLANEOUS) ×3 IMPLANT
POUCH RETRIEVAL ECOSAC 10 (ENDOMECHANICALS) IMPLANT
POUCH RETRIEVAL ECOSAC 10MM (ENDOMECHANICALS)
POUCH SPECIMEN RETRIEVAL 10MM (ENDOMECHANICALS) IMPLANT
SCISSORS LAP 5X35 DISP (ENDOMECHANICALS) ×3 IMPLANT
SET CHOLANGIOGRAPH 5 50 .035 (SET/KITS/TRAYS/PACK) ×3 IMPLANT
SET IRRIG TUBING LAPAROSCOPIC (IRRIGATION / IRRIGATOR) ×3 IMPLANT
SET TUBE SMOKE EVAC HIGH FLOW (TUBING) ×3 IMPLANT
SLEEVE ENDOPATH XCEL 5M (ENDOMECHANICALS) ×3 IMPLANT
SPECIMEN JAR SMALL (MISCELLANEOUS) ×3 IMPLANT
SPONGE GAUZE 2X2 8PLY STER LF (GAUZE/BANDAGES/DRESSINGS) ×1
SPONGE GAUZE 2X2 8PLY STRL LF (GAUZE/BANDAGES/DRESSINGS) ×1 IMPLANT
SPONGE GAUZE 2X2 STER 10/PKG (GAUZE/BANDAGES/DRESSINGS) ×2
STRIP CLOSURE SKIN 1/2X4 (GAUZE/BANDAGES/DRESSINGS) ×2 IMPLANT
SUT MNCRL AB 4-0 PS2 18 (SUTURE) ×3 IMPLANT
TOWEL OR 17X24 6PK STRL BLUE (TOWEL DISPOSABLE) ×3 IMPLANT
TOWEL OR 17X26 10 PK STRL BLUE (TOWEL DISPOSABLE) ×3 IMPLANT
TRAY LAPAROSCOPIC MC (CUSTOM PROCEDURE TRAY) ×3 IMPLANT
TROCAR XCEL BLUNT TIP 100MML (ENDOMECHANICALS) ×3 IMPLANT
TROCAR XCEL NON-BLD 11X100MML (ENDOMECHANICALS) ×3 IMPLANT
TROCAR XCEL NON-BLD 5MMX100MML (ENDOMECHANICALS) ×3 IMPLANT
WATER STERILE IRR 1000ML POUR (IV SOLUTION) ×3 IMPLANT

## 2019-01-24 NOTE — Discharge Instructions (Signed)
° ° °Managing Your Pain After Surgery Without Opioids ° ° ° °Thank you for participating in our program to help patients manage their pain after surgery without opioids. This is part of our effort to provide you with the best care possible, without exposing you or your family to the risk that opioids pose. ° °What pain can I expect after surgery? °You can expect to have some pain after surgery. This is normal. The pain is typically worse the day after surgery, and quickly begins to get better. °Many studies have found that many patients are able to manage their pain after surgery with Over-the-Counter (OTC) medications such as Tylenol and Motrin. If you have a condition that does not allow you to take Tylenol or Motrin, notify your surgical team. ° °How will I manage my pain? °The best strategy for controlling your pain after surgery is around the clock pain control with Tylenol (acetaminophen) and Motrin (ibuprofen or Advil). Alternating these medications with each other allows you to maximize your pain control. In addition to Tylenol and Motrin, you can use heating pads or ice packs on your incisions to help reduce your pain. ° °How will I alternate your regular strength over-the-counter pain medication? °You will take a dose of pain medication every three hours. °; Start by taking 650 mg of Tylenol (2 pills of 325 mg) °; 3 hours later take 600 mg of Motrin (3 pills of 200 mg) °; 3 hours after taking the Motrin take 650 mg of Tylenol °; 3 hours after that take 600 mg of Motrin. ° ° °- 1 - ° °See example - if your first dose of Tylenol is at 12:00 PM ° ° °12:00 PM Tylenol 650 mg (2 pills of 325 mg)  °3:00 PM Motrin 600 mg (3 pills of 200 mg)  °6:00 PM Tylenol 650 mg (2 pills of 325 mg)  °9:00 PM Motrin 600 mg (3 pills of 200 mg)  °Continue alternating every 3 hours  ° °We recommend that you follow this schedule around-the-clock for at least 3 days after surgery, or until you feel that it is no longer needed. Use  the table on the last page of this handout to keep track of the medications you are taking. °Important: °Do not take more than 3000mg of Tylenol or 3200mg of Motrin in a 24-hour period. °Do not take ibuprofen/Motrin if you have a history of bleeding stomach ulcers, severe kidney disease, &/or actively taking a blood thinner ° °What if I still have pain? °If you have pain that is not controlled with the over-the-counter pain medications (Tylenol and Motrin or Advil) you might have what we call “breakthrough” pain. You will receive a prescription for a small amount of an opioid pain medication such as Oxycodone, Tramadol, or Tylenol with Codeine. Use these opioid pills in the first 24 hours after surgery if you have breakthrough pain. Do not take more than 1 pill every 4-6 hours. ° °If you still have uncontrolled pain after using all opioid pills, don't hesitate to call our staff using the number provided. We will help make sure you are managing your pain in the best way possible, and if necessary, we can provide a prescription for additional pain medication. ° ° °Day 1   ° °Time  °Name of Medication Number of pills taken  °Amount of Acetaminophen  °Pain Level  ° °Comments  °AM PM       °AM PM       °AM PM       °  AM PM       °AM PM       °AM PM       °AM PM       °AM PM       °Total Daily amount of Acetaminophen °Do not take more than  3,000 mg per day    ° ° °Day 2   ° °Time  °Name of Medication Number of pills °taken  °Amount of Acetaminophen  °Pain Level  ° °Comments  °AM PM       °AM PM       °AM PM       °AM PM       °AM PM       °AM PM       °AM PM       °AM PM       °Total Daily amount of Acetaminophen °Do not take more than  3,000 mg per day    ° ° °Day 3   ° °Time  °Name of Medication Number of pills taken  °Amount of Acetaminophen  °Pain Level  ° °Comments  °AM PM       °AM PM       °AM PM       °AM PM       ° ° ° °AM PM       °AM PM       °AM PM       °AM PM       °Total Daily amount of Acetaminophen °Do  not take more than  3,000 mg per day    ° ° °Day 4   ° °Time  °Name of Medication Number of pills taken  °Amount of Acetaminophen  °Pain Level  ° °Comments  °AM PM       °AM PM       °AM PM       °AM PM       °AM PM       °AM PM       °AM PM       °AM PM       °Total Daily amount of Acetaminophen °Do not take more than  3,000 mg per day    ° ° °Day 5   ° °Time  °Name of Medication Number °of pills taken  °Amount of Acetaminophen  °Pain Level  ° °Comments  °AM PM       °AM PM       °AM PM       °AM PM       °AM PM       °AM PM       °AM PM       °AM PM       °Total Daily amount of Acetaminophen °Do not take more than  3,000 mg per day    ° ° ° °Day 6   ° °Time  °Name of Medication Number of pills °taken  °Amount of Acetaminophen  °Pain Level  °Comments  °AM PM       °AM PM       °AM PM       °AM PM       °AM PM       °AM PM       °AM PM       °AM PM       °Total Daily amount of Acetaminophen °Do not take more than    3,000 mg per day    ° ° °Day 7   ° °Time  °Name of Medication Number of pills taken  °Amount of Acetaminophen  °Pain Level  ° °Comments  °AM PM       °AM PM       °AM PM       °AM PM       °AM PM       °AM PM       °AM PM       °AM PM       °Total Daily amount of Acetaminophen °Do not take more than  3,000 mg per day    ° ° ° ° °For additional information about how and where to safely dispose of unused opioid °medications - https://www.morepowerfulnc.org ° °Disclaimer: This document contains information and/or instructional materials adapted from [facility] for the typical patient with your condition. It does not replace medical advice from your health care provider because your experience may differ from that of the °typical patient. Talk to your health care provider if you have any questions about this °document, your condition or your treatment plan. °Adapted from [facility] ° °[facility] °LAPAROSCOPIC SURGERY: POST OP INSTRUCTIONS °Always review your discharge  instruction sheet given to you by the facility where your surgery was performed. °IF YOU HAVE DISABILITY OR FAMILY LEAVE FORMS, YOU MUST BRING THEM TO THE OFFICE FOR PROCESSING.   °DO NOT GIVE THEM TO YOUR DOCTOR. ° °PAIN CONTROL ° °1. First take acetaminophen (Tylenol) AND/or ibuprofen (Advil) to control your pain after surgery.  Follow directions on package.  Taking acetaminophen (Tylenol) and/or ibuprofen (Advil) regularly after surgery will help to control your pain and lower the amount of prescription pain medication you may need.  You should not take more than 3,000 mg (3 grams) of acetaminophen (Tylenol) in 24 hours.  You should not take ibuprofen (Advil), aleve, motrin, naprosyn or other NSAIDS if you have a history of stomach ulcers or chronic kidney disease.  °2. A prescription for pain medication may be given to you upon discharge.  Take your pain medication as prescribed, if you still have uncontrolled pain after taking acetaminophen (Tylenol) or ibuprofen (Advil). °3. Use ice packs to help control pain. °4. If you need a refill on your pain medication, please contact your pharmacy.  They will contact our office to request authorization. Prescriptions will not be filled after 5pm or on week-ends. ° °HOME MEDICATIONS °5. Take your usually prescribed medications unless otherwise directed. ° °DIET °6. You should follow a light diet the first few days after arrival home.  Be sure to include lots of fluids daily. Avoid fatty, fried foods.  ° °CONSTIPATION °7. It is common to experience some constipation after surgery and if you are taking pain medication.  Increasing fluid intake and taking a stool softener (such as Colace) will usually help or prevent this problem from occurring.  A mild laxative (Milk of Magnesia or Miralax) should be taken according to package instructions if there are no bowel movements after 48 hours. ° °WOUND/INCISION CARE °8. Most patients will experience some swelling and bruising in  the area of the incisions.  Ice packs will help.  Swelling and bruising can take several days to resolve.  °9. Unless discharge instructions indicate otherwise, follow guidelines below  °a. STERI-STRIPS - you may remove your outer bandages 48 hours after surgery, and you may shower at that time.  You have steri-strips (  small skin tapes) in place directly over the incision.  These strips should be left on the skin for 7-10 days.   °b. DERMABOND/SKIN GLUE - you may shower in 24 hours.  The glue will flake off over the next 2-3 weeks. °10. Any sutures or staples will be removed at the office during your follow-up visit. ° °ACTIVITIES °11. You may resume regular (light) daily activities beginning the next day--such as daily self-care, walking, climbing stairs--gradually increasing activities as tolerated.  You may have sexual intercourse when it is comfortable.  Refrain from any heavy lifting or straining until approved by your doctor. °a. You may drive when you are no longer taking prescription pain medication, you can comfortably wear a seatbelt, and you can safely maneuver your car and apply brakes. ° °FOLLOW-UP °12. You should see your doctor in the office for a follow-up appointment approximately 2-3 weeks after your surgery.  You should have been given your post-op/follow-up appointment when your surgery was scheduled.  If you did not receive a post-op/follow-up appointment, make sure that you call for this appointment within a day or two after you arrive home to insure a convenient appointment time. ° ° °WHEN TO CALL YOUR DOCTOR: °1. Fever over 101.0 °2. Inability to urinate °3. Continued bleeding from incision. °4. Increased pain, redness, or drainage from the incision. °5. Increasing abdominal pain ° °The clinic staff is available to answer your questions during regular business hours.  Please don’t hesitate to call and ask to speak to one of the nurses for clinical concerns.  If you have a medical emergency,  go to the nearest emergency room or call 911.  A surgeon from [facility] is always on call at the hospital. °[address], [address], [place], [place]  [address] ? [address] [address], , [place]   [address] °[phone] ? [phone] ? FAX [phone] °Web site: [email or web address] ° °

## 2019-01-24 NOTE — Anesthesia Procedure Notes (Signed)
Procedure Name: Intubation Date/Time: 01/24/2019 9:11 AM Performed by: Shirlyn Goltz, CRNA Pre-anesthesia Checklist: Patient identified, Emergency Drugs available, Suction available and Patient being monitored Patient Re-evaluated:Patient Re-evaluated prior to induction Oxygen Delivery Method: Circle system utilized Preoxygenation: Pre-oxygenation with 100% oxygen Induction Type: IV induction and Rapid sequence Laryngoscope Size: Mac and 3 Grade View: Grade I Tube type: Oral Tube size: 7.0 mm Number of attempts: 1 Airway Equipment and Method: Stylet Placement Confirmation: ETT inserted through vocal cords under direct vision,  positive ETCO2 and breath sounds checked- equal and bilateral Secured at: 22 cm Tube secured with: Tape Dental Injury: Teeth and Oropharynx as per pre-operative assessment

## 2019-01-24 NOTE — Progress Notes (Signed)
Returned back to 6n29 from PACU. A/Ox4, denies nausea/ pain.

## 2019-01-24 NOTE — Op Note (Signed)
Laparoscopic Cholecystectomy with IOC Procedure Note  Indications: This patient presents with symptomatic gallbladder disease and will undergo laparoscopic cholecystectomy.  Pre-operative Diagnosis: Calculus of gallbladder with acute cholecystitis, without mention of obstruction  Post-operative Diagnosis: Same  Surgeon: Wynona Luna   Assistants: Dr. Violeta Gelinas   Anesthesia: General endotracheal anesthesia  ASA Class: 1  Procedure Details  The patient was seen again in the Holding Room. The risks, benefits, complications, treatment options, and expected outcomes were discussed with the patient. The possibilities of reaction to medication, pulmonary aspiration, perforation of viscus, bleeding, recurrent infection, finding a normal gallbladder, the need for additional procedures, failure to diagnose a condition, the possible need to convert to an open procedure, and creating a complication requiring transfusion or operation were discussed with the patient. The likelihood of improving the patient's symptoms with return to their baseline status is good.  The patient and/or family concurred with the proposed plan, giving informed consent. The site of surgery properly noted. The patient was taken to Operating Room, identified as Beth [name] and the procedure verified as Laparoscopic Cholecystectomy with Intraoperative Cholangiogram. A Time Out was held and the above information confirmed.  Prior to the induction of general anesthesia, antibiotic prophylaxis was administered. General endotracheal anesthesia was then administered and tolerated well. After the induction, the abdomen was prepped with Chloraprep and draped in the sterile fashion. The patient was positioned in the supine position.  Local anesthetic agent was injected into the skin above the umbilicus and an incision made. We dissected down to the abdominal fascia with blunt dissection.  The fascia was incised vertically and we  entered the peritoneal cavity bluntly.  A pursestring suture of 0-Vicryl was placed around the fascial opening.  The Hasson cannula was inserted and secured with the stay suture.  Pneumoperitoneum was then created with CO2 and tolerated well without any adverse changes in the patient's vital signs. An 11-mm port was placed in the subxiphoid position.  Two 5-mm ports were placed in the right upper quadrant. All skin incisions were infiltrated with a local anesthetic agent before making the incision and placing the trocars.   We positioned the patient in reverse Trendelenburg, tilted slightly to the patient's left.  The gallbladder was identified, the fundus grasped and retracted cephalad. The gallbladder is quite edematous and thickened.  Adhesions were lysed bluntly and with the electrocautery where indicated, taking care not to injure any adjacent organs or viscus. The infundibulum was grasped and retracted laterally, exposing the peritoneum overlying the triangle of Calot. This was then divided and exposed in a blunt fashion. A critical view of the cystic duct and cystic artery was obtained.  The cystic duct was clearly identified and bluntly dissected circumferentially. The cystic duct was ligated with a clip distally.   An incision was made in the cystic duct and the [facility] cholangiogram catheter introduced. The catheter was secured using a clip. A cholangiogram was then obtained which showed good visualization of the distal and proximal biliary tree with no sign of filling defects or obstruction.  Contrast flowed easily into the duodenum. The catheter was then removed.   The cystic duct was then ligated with clips and divided. The cystic artery was identified, dissected free, ligated with clips and divided as well.   The gallbladder was dissected from the liver bed in retrograde fashion with the electrocautery. The gallbladder was removed and placed in an Eco sac. The liver bed was irrigated and inspected.  Hemostasis was achieved with the  electrocautery and SNOW. Copious irrigation was utilized and was repeatedly aspirated until clear.  The gallbladder and Eco sac were then removed through the umbilical port site.  The pursestring suture was used to close the umbilical fascia.    We again inspected the right upper quadrant for hemostasis.  Pneumoperitoneum was released as we removed the trocars.  4-0 Monocryl was used to close the skin.   Benzoin, steri-strips, and clean dressings were applied. The patient was then extubated and brought to the recovery room in stable condition. Instrument, sponge, and needle counts were correct at closure and at the conclusion of the case.   Findings: Cholecystitis with Cholelithiasis  Estimated Blood Loss: less than 50 mL         Drains: none         Specimens: Gallbladder           Complications: None; patient tolerated the procedure well.         Disposition: PACU - hemodynamically stable.         Condition: stable  Beth ArmsMatthew K. Corliss Skainssuei, MD, [facility]  General/ Trauma Surgery Beeper [phone]  01/24/2019 10:15 AM

## 2019-01-24 NOTE — TOC Initial Note (Signed)
Transition of Care [facility]) - Initial/Assessment Note    Patient Details  Name: Beth [name] MRN: [id number] Date of Birth: 07-18-1991  Transition [facility] Care [facility]) CM/SW Contact:    Kingsley Plan, RN Phone Number: 01/24/2019, 12:06 PM  Clinical Narrative:                  Will continue to follow for discharge needs. May need MATCH pending discharge prescriptions  Expected Discharge Plan: Home/Self Care Barriers to Discharge: Continued Medical Work up   Patient Goals and CMS Choice Patient states their goals for this hospitalization and ongoing recovery are:: to go home  CMS Medicare.gov Compare Post Acute Care list provided to:: Patient Choice offered to / list presented to : NA  Expected Discharge Plan and Services Expected Discharge Plan: Home/Self Care In-house Referral: Financial Counselor Discharge Planning Services: CM Consult, Indigent [facility]   Living arrangements for the past 2 months: Single Family Home                 DME Arranged: N/A DME Agency: NA         HH Agency: NA        Prior Living Arrangements/Services Living arrangements for the past 2 months: Single Family Home   Patient language and need for interpreter reviewed:: Yes              Criminal Activity/Legal Involvement Pertinent to Current Situation/Hospitalization: No - Comment as needed  Activities of Daily Living      Permission Sought/Granted                  Emotional Assessment Appearance:: Appears stated age Attitude/Demeanor/Rapport: Engaged Affect (typically observed): Accepting Orientation: : Oriented to Self, Oriented to Place, Oriented to  Time, Oriented to Situation   Psych Involvement: No (comment)  Admission diagnosis:  Cholecystitis [K81.9] Abdominal pain [R10.9] Patient Active Problem List   Diagnosis Date Noted  . Acute cholecystitis 01/23/2019   PCP:  Patient, No Pcp Per Pharmacy:   [id number] [place], [place] - [address] AT [facility] [address] [facility] [address] [place] [phone] Phone: [phone] Fax: [phone]     Social Determinants of Health (SDOH) Interventions    Readmission Risk Interventions No flowsheet data found.

## 2019-01-24 NOTE — Anesthesia Preprocedure Evaluation (Signed)
Anesthesia Evaluation  Patient identified by MRN, date of birth, ID band Patient awake    Reviewed: Allergy & Precautions, NPO status , Patient's Chart, lab work & pertinent test results  Airway Mallampati: II  TM Distance: >3 FB     Dental  (+) Dental Advisory Given   Pulmonary neg pulmonary ROS,    breath sounds clear to auscultation       Cardiovascular negative cardio ROS   Rhythm:Regular Rate:Normal     Neuro/Psych negative neurological ROS     GI/Hepatic negative GI ROS, Neg liver ROS, Acute cholecystitis    Endo/Other  negative endocrine ROS  Renal/GU negative Renal ROS     Musculoskeletal   Abdominal   Peds  Hematology negative hematology ROS (+)   Anesthesia Other Findings   Reproductive/Obstetrics                             Lab Results  Component Value Date   WBC 9.0 01/24/2019   HGB 12.6 01/24/2019   HCT 37.9 01/24/2019   MCV 90.5 01/24/2019   PLT 249 01/24/2019   Lab Results  Component Value Date   CREATININE 0.81 01/24/2019   BUN 7 01/24/2019   NA 138 01/24/2019   K 3.4 (L) 01/24/2019   CL 108 01/24/2019   CO2 21 (L) 01/24/2019    Anesthesia Physical Anesthesia Plan  ASA: II  Anesthesia Plan: General   Post-op Pain Management:    Induction: Intravenous  PONV Risk Score and Plan: 3 and Dexamethasone, Ondansetron, Treatment may vary due to age or medical condition and Midazolam  Airway Management Planned: Oral ETT  Additional Equipment:   Intra-op Plan:   Post-operative Plan: Extubation in OR  Informed Consent: I have reviewed the patients History and Physical, chart, labs and discussed the procedure including the risks, benefits and alternatives for the proposed anesthesia with the patient or authorized representative who has indicated his/her understanding and acceptance.     Dental advisory given  Plan Discussed with: CRNA  Anesthesia  Plan Comments:         Anesthesia Quick Evaluation

## 2019-01-24 NOTE — Transfer of Care (Signed)
Immediate Anesthesia Transfer of Care Note  Patient: Beth [name]  Procedure(s) Performed: LAPAROSCOPIC CHOLECYSTECTOMY WITH INTRAOPERATIVE CHOLANGIOGRAM (Left Abdomen)  Patient Location: PACU  Anesthesia Type:General  Level of Consciousness: awake, alert , oriented and patient cooperative  Airway & Oxygen Therapy: Patient Spontanous Breathing  Post-op Assessment: Report given to RN and Post -op Vital signs reviewed and stable  Post vital signs: Reviewed and stable  Last Vitals:  Vitals Value Taken Time  BP    Temp    Pulse 95 01/24/2019 10:37 AM  Resp 19 01/24/2019 10:37 AM  SpO2 96 % 01/24/2019 10:37 AM  Vitals shown include unvalidated device data.  Last Pain:  Vitals:   01/24/19 0746  TempSrc: Oral  PainSc:       Patients Stated Pain Goal: 0 (01/23/19 1126)  Complications: No apparent anesthesia complications

## 2019-01-24 NOTE — Anesthesia Postprocedure Evaluation (Signed)
Anesthesia Post Note  Patient: Beth [name]  Procedure(s) Performed: LAPAROSCOPIC CHOLECYSTECTOMY WITH INTRAOPERATIVE CHOLANGIOGRAM (Left Abdomen)     Patient location during evaluation: PACU Anesthesia Type: General Level of consciousness: awake and alert Pain management: pain level controlled Vital Signs Assessment: post-procedure vital signs reviewed and stable Respiratory status: spontaneous breathing, nonlabored ventilation, respiratory function stable and patient connected to nasal cannula oxygen Cardiovascular status: blood pressure returned to baseline and stable Postop Assessment: no apparent nausea or vomiting Anesthetic complications: no    Last Vitals:  Vitals:   01/24/19 1137 01/24/19 1338  BP: (!) 105/55 106/65  Pulse: 77 66  Resp: 16   Temp: 37.1 C 37 C  SpO2: 98% 95%    Last Pain:  Vitals:   01/24/19 1338  TempSrc: Oral  PainSc:                  Kennieth Rad

## 2019-01-24 NOTE — Progress Notes (Signed)
Day of Surgery   Subjective/Chief Complaint: No abdominal pain currently No nausea or vomiting overnight   Objective: Vital signs in last 24 hours: Temp:  [98 F (36.7 C)-98.5 F (36.9 C)] 98.5 F (36.9 C) (04/24 0450) Pulse Rate:  [49-70] 61 (04/24 0450) Resp:  [16-18] 17 (04/24 0450) BP: (113-140)/(56-82) 114/56 (04/24 0450) SpO2:  [100 %] 100 % (04/23 1647) Last BM Date: 01/22/19  Intake/Output from previous day: 04/23 0701 - 04/24 0700 In: 1277.7 [P.O.:540; I.V.:637.7; IV Piggyback:100] Out: -  Intake/Output this shift: No intake/output data recorded.  General appearance: alert, cooperative and no distress GI: soft, non-tender; bowel sounds normal; no masses,  no organomegaly  Lab Results:  Recent Labs    01/23/19 1816 01/24/19 0223  WBC 12.0* 9.0  HGB 13.9 12.6  HCT 41.6 37.9  PLT 284 249   BMET Recent Labs    01/23/19 1126 01/23/19 1816 01/24/19 0223  NA 140  --  138  K 4.0  --  3.4*  CL 108  --  108  CO2 24  --  21*  GLUCOSE 131*  --  104*  BUN 7  --  7  CREATININE 0.87 0.78 0.81  CALCIUM 9.1  --  8.4*   Hepatic Function Latest Ref Rng & Units 01/24/2019 01/23/2019  Total Protein 6.5 - 8.1 g/dL 6.7 7.6  Albumin 3.5 - 5.0 g/dL 3.2(L) 3.9  AST 15 - 41 U/L 49(H) 21  ALT 0 - 44 U/L 37 16  Alk Phosphatase 38 - 126 U/L 54 59  Total Bilirubin 0.3 - 1.2 mg/dL 1.2 0.8    PT/INR No results for input(s): LABPROT, INR in the last 72 hours. ABG No results for input(s): PHART, HCO3 in the last 72 hours.  Invalid input(s): PCO2, PO2  Studies/Results: Ct Abdomen Pelvis W Contrast  Result Date: 01/23/2019 CLINICAL DATA:  Abdominal pain EXAM: CT ABDOMEN AND PELVIS WITH CONTRAST TECHNIQUE: Multidetector CT imaging of the abdomen and pelvis was performed using the standard protocol following bolus administration of intravenous contrast. CONTRAST:  137m OMNIPAQUE IOHEXOL 300 MG/ML  SOLN COMPARISON:  None. FINDINGS: Lower chest: On axial slice 1 series 4,  there is a 2 mm nodular opacity in the right middle lobe. Lung bases otherwise are clear. Hepatobiliary: No focal liver lesions are appreciable. There are nitrogen containing gallstones in the gallbladder. The gallbladder wall appears mildly edematous by CT. There is no biliary duct dilatation. Pancreas: There is no pancreatic mass or inflammatory focus. Spleen: No splenic lesions are evident. Adrenals/Urinary Tract: Adrenals bilaterally appear normal. Kidneys bilaterally show no evident mass or hydronephrosis on either side. There is no renal or ureteral calculus on either side. Urinary bladder is midline with wall thickness within normal limits. Stomach/Bowel: There is no appreciable bowel wall or mesenteric thickening. There is no appreciable bowel obstruction. There is no free air or portal venous air. Vascular/Lymphatic: There is no abdominal aortic aneurysm. No vascular lesions are evident. There is no adenopathy in the abdomen or pelvis. Reproductive: The uterus is anteverted. There is no evident pelvic mass. Other: The appendix appears normal. There is no abscess or ascites in the abdomen or pelvis. Musculoskeletal: No blastic or lytic bone lesions are evident. There is no intramuscular or abdominal wall lesion evident. IMPRESSION: 1. Nitrogen containing gallstones in the gallbladder. The gallbladder wall appears mildly edematous by CT. Advise correlation with ultrasound of the gallbladder to further assess for potential changes of early acute cholecystitis. 2. No evident bowel obstruction. No  abscess in the abdomen or pelvis. Appendix appears normal. 3. No renal or ureteral calculus. No hydronephrosis. Urinary bladder wall thickness is normal. 4. 2 mm nodular opacity in the right middle lobe. This finding is almost certainly benign in this age group. Unless patient has a history of underlying neoplasm, no further imaging of this small nodular lesion is felt to be warranted. Electronically Signed   By:  Lowella Grip III M.D.   On: 01/23/2019 13:51   US Abdomen Limited Ruq  Result Date: 01/23/2019 CLINICAL DATA:  Upper abdominal pain EXAM: ULTRASOUND ABDOMEN LIMITED RIGHT UPPER QUADRANT COMPARISON:  CT abdomen and pelvis January 23, 2019 FINDINGS: Gallbladder: Within the gallbladder, there are echogenic foci which move and shadow consistent with cholelithiasis. Largest gallstone measures 1.8 cm in length. There is thickening of the gallbladder wall with subtle wall edema. No pericholecystic fluid. Patient is focally tender over the gallbladder. Common bile duct: Diameter: 3 mm. No intrahepatic or extrahepatic biliary duct dilatation. Liver: No focal lesion identified. Within normal limits in parenchymal echogenicity. Portal vein is patent on color Doppler imaging with normal direction of blood flow towards the liver. IMPRESSION: Cholelithiasis. Gallbladder wall is mildly thickened and subtly edematous. Patient is tender focally over the gallbladder. These are findings concerning for a degree of acute cholecystitis. Study otherwise unremarkable. Electronically Signed   By: Lowella Grip III M.D.   On: 01/23/2019 14:44    Anti-infectives: Anti-infectives (From admission, onward)   Start     Dose/Rate Route Frequency Ordered Stop   01/24/19 1600  cefTRIAXone (ROCEPHIN) 2 g in sodium chloride 0.9 % 100 mL IVPB     2 g 200 mL/hr over 30 Minutes Intravenous Every 24 hours 01/23/19 1533     01/23/19 1500  cefTRIAXone (ROCEPHIN) 2 g in sodium chloride 0.9 % 100 mL IVPB     2 g 200 mL/hr over 30 Minutes Intravenous  Once 01/23/19 1453 01/23/19 1702      Assessment/Plan: Acute calculus cholecystitis.  Plan laparoscopic cholecystectomy with intraoperative cholangiogram today.  The surgical procedure has been discussed with the patient.  Potential risks, benefits, alternative treatments, and expected outcomes have been explained.  All of the patient's questions at this time have been answered.  The  likelihood of reaching the patient's treatment goal is good.  The patient understand the proposed surgical procedure and wishes to proceed.   LOS: 0 days    Maia Petties 01/24/2019

## 2019-01-25 ENCOUNTER — Encounter (HOSPITAL_COMMUNITY): Payer: Self-pay | Admitting: Surgery

## 2019-01-25 LAB — URINE CULTURE

## 2019-01-25 MED ORDER — OXYCODONE HCL 5 MG PO TABS: 5.0000 mg | ORAL_TABLET | Freq: Four times a day (QID) | ORAL | 0 refills | Status: DC | PRN

## 2019-01-25 MED ORDER — ACETAMINOPHEN 325 MG PO TABS: 650.0000 mg | ORAL_TABLET | Freq: Four times a day (QID) | ORAL | Status: DC | PRN

## 2019-01-25 MED ORDER — IBUPROFEN 200 MG PO TABS: 600.0000 mg | ORAL_TABLET | Freq: Four times a day (QID) | ORAL | Status: DC | PRN

## 2019-01-25 NOTE — Progress Notes (Signed)
Discharged pt to home, alert, oriented and stable, no signs of distress. Instructions given, explained and concerns were addressed accordingly. Belongings were returned.

## 2019-01-25 NOTE — Discharge Summary (Signed)
[facility] Discharge Summary   Patient ID: Beth [name] MRN: [id number] DOB/AGE: 1990-11-21 28 y.o.  Admit date: 01/23/2019 Discharge date: 01/25/2019  Admitting Diagnosis: Acute cholecystitis  Discharge Diagnosis Patient Active Problem List   Diagnosis Date Noted  . Acute cholecystitis 01/23/2019    Consultants None  Imaging: Dg Cholangiogram Operative  Result Date: 01/24/2019 CLINICAL DATA:  Cholelithiasis EXAM: INTRAOPERATIVE CHOLANGIOGRAM TECHNIQUE: Cholangiographic images from the C-arm fluoroscopic device were submitted for interpretation post-operatively. Please see the procedural report for the amount of contrast and the fluoroscopy time utilized. COMPARISON:  None. FINDINGS: Contrast fills the biliary tree and duodenum without filling defects in the common bile duct. IMPRESSION: Patent biliary tree. Electronically Signed   By: Jolaine ClickArthur  Hoss M.D.   On: 01/24/2019 11:27   Ct Abdomen Pelvis W Contrast  Result Date: 01/23/2019 CLINICAL DATA:  Abdominal pain EXAM: CT ABDOMEN AND PELVIS WITH CONTRAST TECHNIQUE: Multidetector CT imaging of the abdomen and pelvis was performed using the standard protocol following bolus administration of intravenous contrast. CONTRAST:  100mL OMNIPAQUE IOHEXOL 300 MG/ML  SOLN COMPARISON:  None. FINDINGS: Lower chest: On axial slice 1 series 4, there is a 2 mm nodular opacity in the right middle lobe. Lung bases otherwise are clear. Hepatobiliary: No focal liver lesions are appreciable. There are nitrogen containing gallstones in the gallbladder. The gallbladder wall appears mildly edematous by CT. There is no biliary duct dilatation. Pancreas: There is no pancreatic mass or inflammatory focus. Spleen: No splenic lesions are evident. Adrenals/Urinary Tract: Adrenals bilaterally appear normal. Kidneys bilaterally show no evident mass or hydronephrosis on either side. There is no renal or ureteral calculus on either side. Urinary bladder is  midline with wall thickness within normal limits. Stomach/Bowel: There is no appreciable bowel wall or mesenteric thickening. There is no appreciable bowel obstruction. There is no free air or portal venous air. Vascular/Lymphatic: There is no abdominal aortic aneurysm. No vascular lesions are evident. There is no adenopathy in the abdomen or pelvis. Reproductive: The uterus is anteverted. There is no evident pelvic mass. Other: The appendix appears normal. There is no abscess or ascites in the abdomen or pelvis. Musculoskeletal: No blastic or lytic bone lesions are evident. There is no intramuscular or abdominal wall lesion evident. IMPRESSION: 1. Nitrogen containing gallstones in the gallbladder. The gallbladder wall appears mildly edematous by CT. Advise correlation with ultrasound of the gallbladder to further assess for potential changes of early acute cholecystitis. 2. No evident bowel obstruction. No abscess in the abdomen or pelvis. Appendix appears normal. 3. No renal or ureteral calculus. No hydronephrosis. Urinary bladder wall thickness is normal. 4. 2 mm nodular opacity in the right middle lobe. This finding is almost certainly benign in this age group. Unless patient has a history of underlying neoplasm, no further imaging of this small nodular lesion is felt to be warranted. Electronically Signed   By: Bretta BangWilliam  Woodruff III M.D.   On: 01/23/2019 13:51   [place] Abdomen Limited Ruq  Result Date: 01/23/2019 CLINICAL DATA:  Upper abdominal pain EXAM: ULTRASOUND ABDOMEN LIMITED RIGHT UPPER QUADRANT COMPARISON:  CT abdomen and pelvis January 23, 2019 FINDINGS: Gallbladder: Within the gallbladder, there are echogenic foci which move and shadow consistent with cholelithiasis. Largest gallstone measures 1.8 cm in length. There is thickening of the gallbladder wall with subtle wall edema. No pericholecystic fluid. Patient is focally tender over the gallbladder. Common bile duct: Diameter: 3 mm. No intrahepatic or  extrahepatic biliary duct dilatation. Liver: No focal lesion identified.  Within normal limits in parenchymal echogenicity. Portal vein is patent on color Doppler imaging with normal direction of blood flow towards the liver. IMPRESSION: Cholelithiasis. Gallbladder wall is mildly thickened and subtly edematous. Patient is tender focally over the gallbladder. These are findings concerning for a degree of acute cholecystitis. Study otherwise unremarkable. Electronically Signed   By: Bretta Bang III M.D.   On: 01/23/2019 14:44    Procedures Dr. Corliss Skains (01/24/19) - Laparoscopic Cholecystectomy with [facility]  Hospital Course:  Patient is a 28 year old female who presented to [facility] with abdominal pain.  Workup showed acute cholecystitis.  Patient was admitted and underwent procedure listed above.  Tolerated procedure well and was transferred to the floor.  Diet was advanced as tolerated.  On POD#1, the patient was voiding well, tolerating diet, ambulating well, pain well controlled, vital signs stable, incisions c/d/i and felt stable for discharge home.  Patient will follow up in our office in 2 weeks and knows to call with questions or concerns. She will call to confirm appointment date/time.    Physical Exam: General:  Alert, NAD, pleasant, comfortable Abd:  Soft, ND, mild tenderness, incisions C/D/I with post-op dressings present    Allergies as of 01/25/2019   No Known Allergies     Medication List    TAKE these medications   acetaminophen 325 MG tablet Commonly known as:  TYLENOL Take 2 tablets (650 mg total) by mouth every 6 (six) hours as needed for mild pain (or temp > 100).   escitalopram 10 MG tablet Commonly known as:  LEXAPRO Take 10 mg by mouth daily.   ibuprofen 200 MG tablet Commonly known as:  ADVIL Take 3 tablets (600 mg total) by mouth every 6 (six) hours as needed for moderate pain.   oxyCODONE 5 MG immediate release tablet Commonly known as:  Oxy IR/ROXICODONE Take 1-2  tablets (5-10 mg total) by mouth every 6 (six) hours as needed for severe pain.        Follow-up Information    [facility], [facility]. Call on 02/06/2019.   Specialty:  General Surgery Why:  Appointment scheduled for 2:45 PM. A provider will call you during scheduled appointment time. Please send photos of driver's license and incisions to [email or web address] with name and DOB prior to appointment. Contact information: [address] [place] [address] [phone]        [facility]. Schedule an appointment as soon as possible for a visit.   Contact information: [address] [place] [address] [phone]          Signed: Wells Guiles, [facility] 01/25/2019, 7:46 AM Pager: [phone] Consults: [phone]

## 2020-04-16 IMAGING — US ULTRASOUND ABDOMEN LIMITED
1 series · 14 of 25 positions shown · non-contrast
Comparison: CT abdomen and pelvis January 23, 2019

CLINICAL DATA: Upper abdominal pain

EXAM:
ULTRASOUND ABDOMEN LIMITED RIGHT UPPER QUADRANT

[Series 1: ultrasound abdomen limited · 14 of 45 slices shown]
[im 1/45]
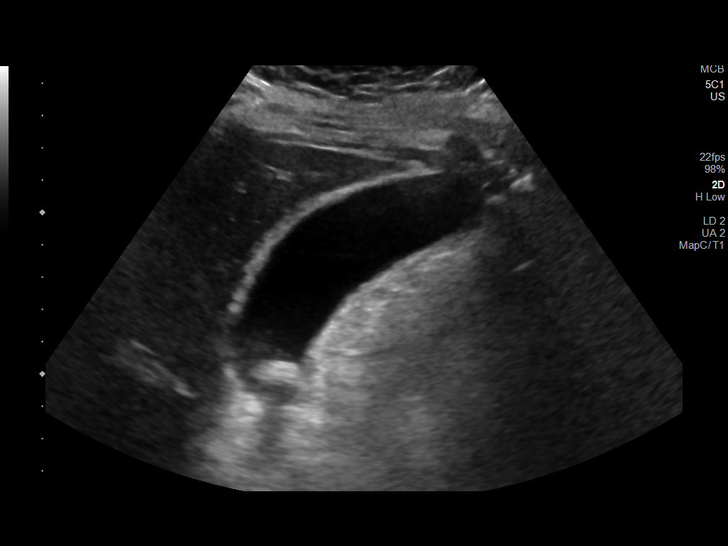
[im 4/45]
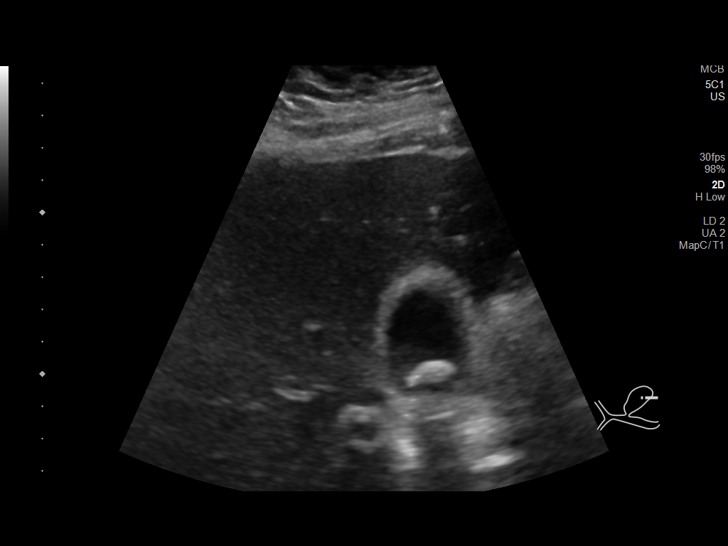
[im 8/45]
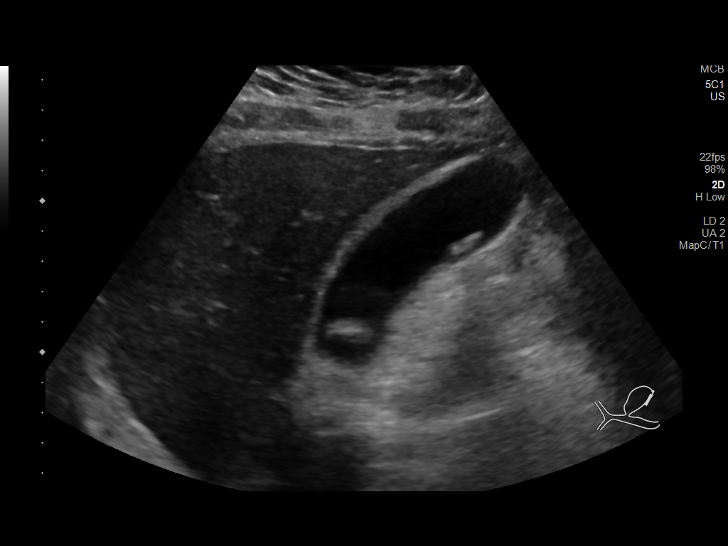
[im 12/45]
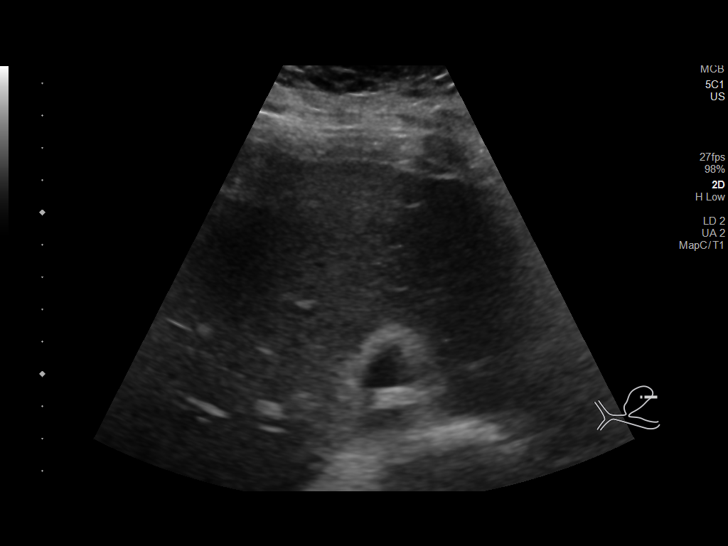
[im 15/45]
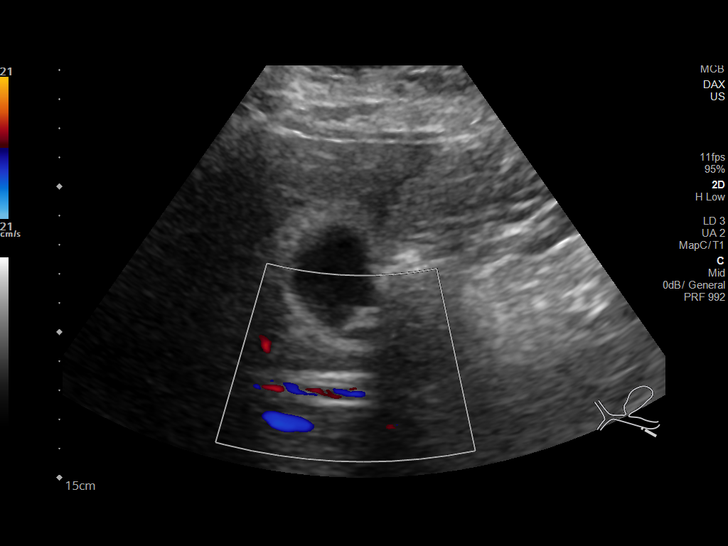
[im 17/45]
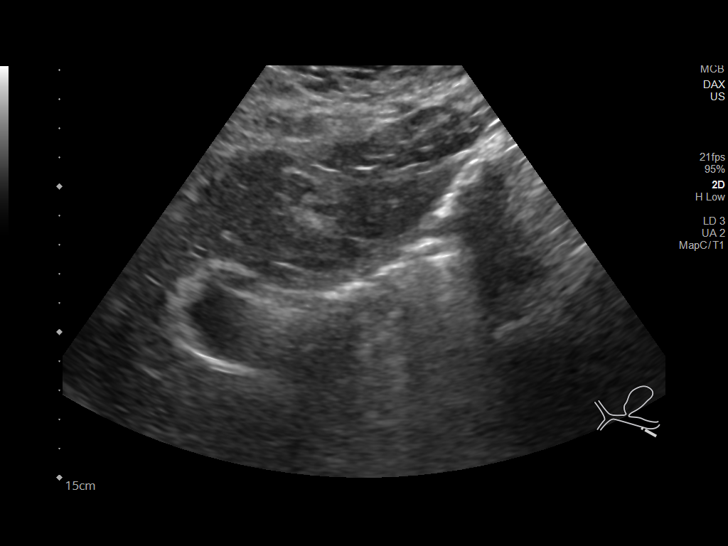
[im 21/45]
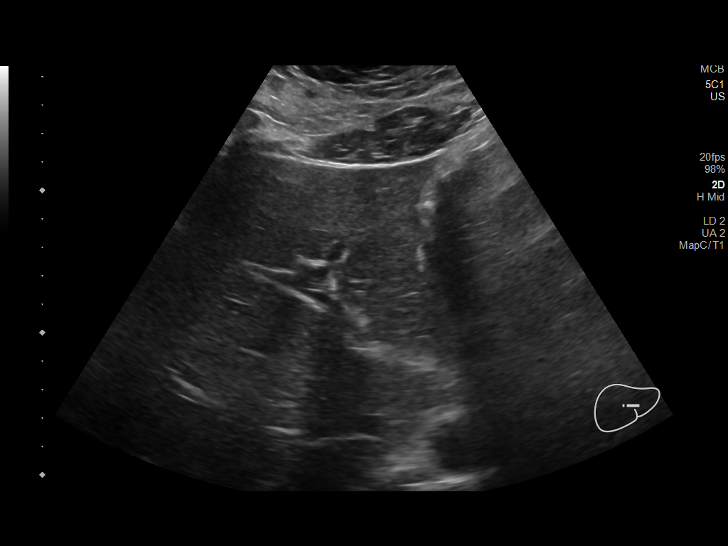
[im 24/45]
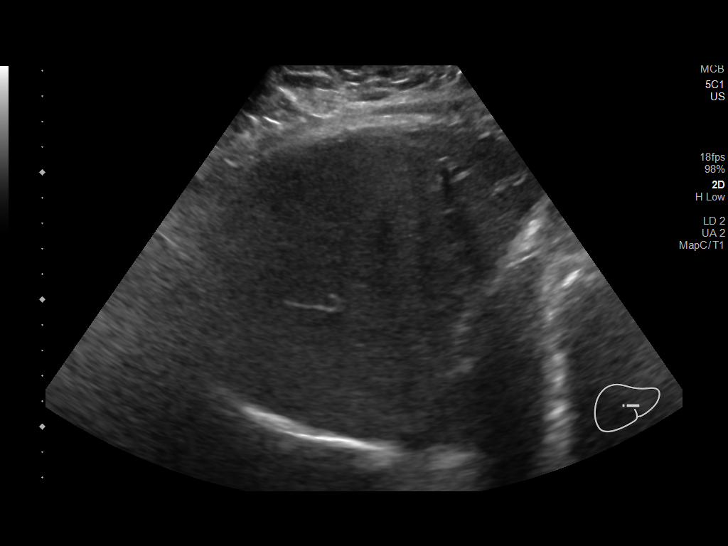
[im 28/45]
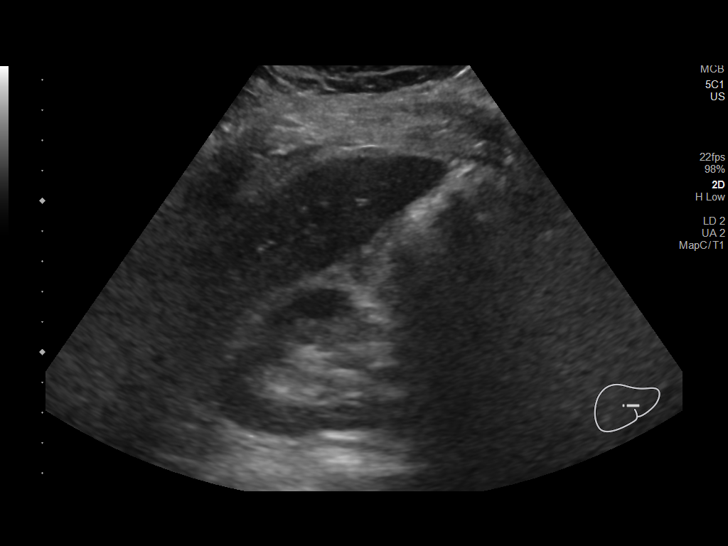
[im 30/45]
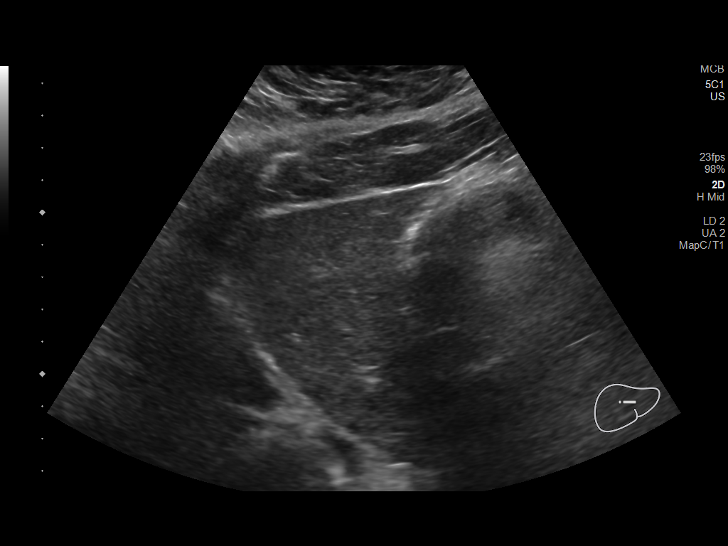
[im 34/45]
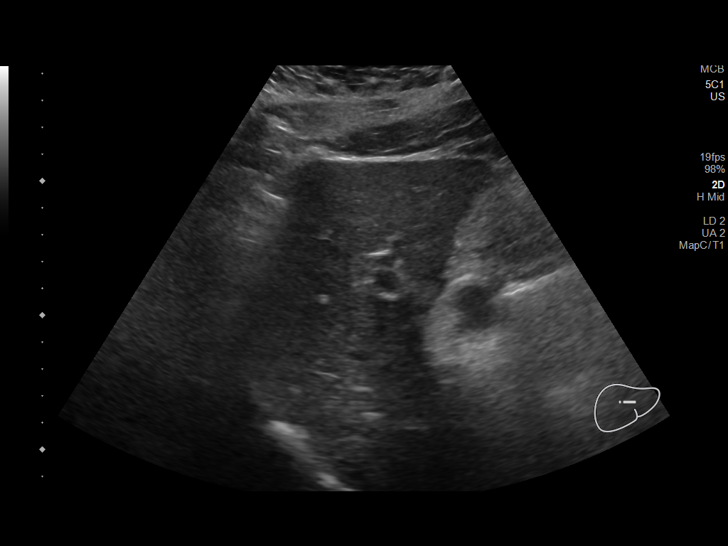
[im 37/45]
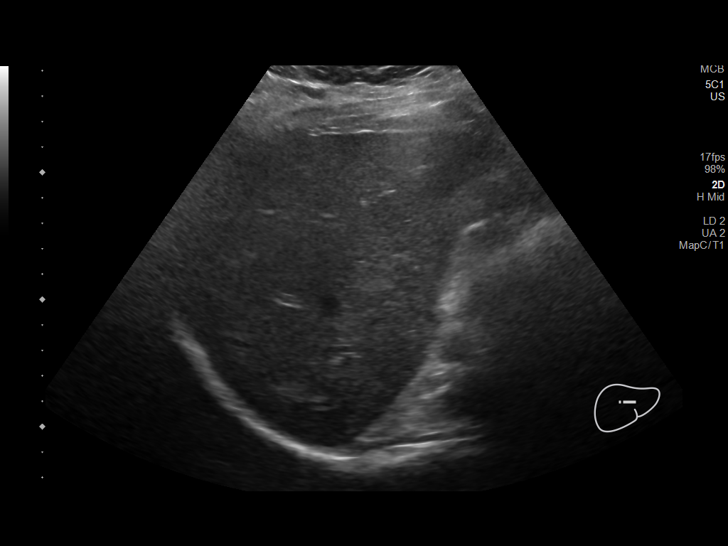
[im 41/45]
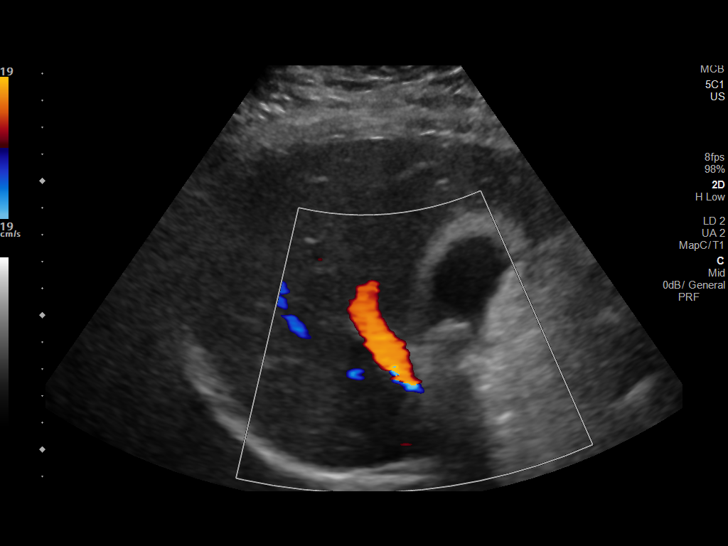
[im 45/45]
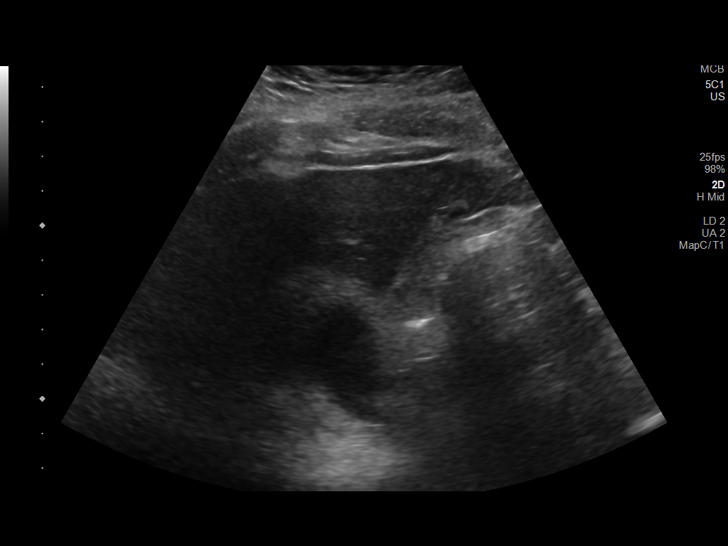

[14 of 25 positions shown; findings below may reference images not displayed]

FINDINGS: Gallbladder:

Within the gallbladder, there are echogenic foci which move and
shadow consistent with cholelithiasis. Largest gallstone measures
1.8 cm in length. There is thickening of the gallbladder wall with
subtle wall edema. No pericholecystic fluid. Patient is focally
tender over the gallbladder.

Common bile duct:

Diameter: 3 mm. No intrahepatic or extrahepatic biliary duct
dilatation.

Liver:

No focal lesion identified. Within normal limits in parenchymal
echogenicity. Portal vein is patent on color Doppler imaging with
normal direction of blood flow towards the liver.
IMPRESSION: Cholelithiasis. Gallbladder wall is mildly thickened and subtly
edematous. Patient is tender focally over the gallbladder. These are
findings concerning for a degree of acute cholecystitis.

Study otherwise unremarkable.

## 2022-08-17 ENCOUNTER — Encounter: Payer: Self-pay | Admitting: Family Medicine

## 2022-08-17 ENCOUNTER — Ambulatory Visit (INDEPENDENT_AMBULATORY_CARE_PROVIDER_SITE_OTHER): Payer: Self-pay | Admitting: Family Medicine

## 2022-08-17 DIAGNOSIS — R5383 Other fatigue: Secondary | ICD-10-CM

## 2022-08-17 DIAGNOSIS — F419 Anxiety disorder, unspecified: Secondary | ICD-10-CM

## 2022-08-17 MED ORDER — ESCITALOPRAM OXALATE 10 MG PO TABS: 10.0000 mg | ORAL_TABLET | Freq: Every day | ORAL | 0 refills | Status: DC

## 2022-08-17 NOTE — Progress Notes (Signed)
New Patient Office Visit - telephone, offered video visit  Subjective    Patient ID: Beth [name], female    DOB: 20-Apr-1991  Age: 31 y.o. MRN: [id number]  CC: No chief complaint on file.   HPI Beth [name] presents to establish care "I need my medicine."   Pt with some fatigue. Sleep issues due to work. Normally sleeps 8hrs a night. Tends to work at night. Has morning fatigue. No recent labs checking for anemia.  Anxiety Presents for initial visit. Onset was more than 5 years ago. The problem has been gradually improving. Symptoms include nervous/anxious behavior. Patient reports no chest pain, compulsions, depressed mood, insomnia, obsessions or suicidal ideas. Episode frequency: controlled on meds. The severity of symptoms is mild. The patient sleeps 8 hours per night. The quality of sleep is fair.   Risk factors include family history. Her past medical history is significant for anxiety/panic attacks. There is no history of depression, hyperthyroidism or suicide attempts. Past treatments include SSRIs. The treatment provided significant relief. Compliance with prior treatments has been good. Past compliance problems: refill requests.     Outpatient Encounter Medications as of 08/17/2022  Medication Sig   acetaminophen (TYLENOL) 325 MG tablet Take 2 tablets (650 mg total) by mouth every 6 (six) hours as needed for mild pain (or temp > 100).   escitalopram (LEXAPRO) 10 MG tablet Take 1 tablet (10 mg total) by mouth daily.   ibuprofen (ADVIL) 200 MG tablet Take 3 tablets (600 mg total) by mouth every 6 (six) hours as needed for moderate pain.   oxyCODONE (OXY IR/ROXICODONE) 5 MG immediate release tablet Take 1-2 tablets (5-10 mg total) by mouth every 6 (six) hours as needed for severe pain.   [DISCONTINUED] escitalopram (LEXAPRO) 10 MG tablet Take 10 mg by mouth daily.   No facility-administered encounter medications on file as of 08/17/2022.    Past Medical History:   Diagnosis Date   Anxiety     Past Surgical History:  Procedure Laterality Date   CHOLECYSTECTOMY Left 01/24/2019   Procedure: LAPAROSCOPIC CHOLECYSTECTOMY WITH INTRAOPERATIVE CHOLANGIOGRAM;  Surgeon: Donnie Mesa, MD;  Location: [facility];  Service: General;  Laterality: Left;    Family History  Problem Relation Age of Onset   Depression Maternal Grandmother    Depression Maternal Grandfather     Social History   Socioeconomic History   Marital status: Single    Spouse name: Not on file   Number of children: Not on file   Years of education: Not on file   Highest education level: Not on file  Occupational History   Not on file  Tobacco Use   Smoking status: Never   Smokeless tobacco: Never   Tobacco comments:    [place]  Vaping Use   Vaping Use: Former  Substance and Sexual Activity   Alcohol use: Yes    Comment: social   Drug use: Never   Sexual activity: Not on file  Other Topics Concern   Not on file  Social History Narrative   Not on file   Social Determinants of Health   Financial Resource Strain: Not on file  Food Insecurity: Not on file  Transportation Needs: Not on file  Physical Activity: Not on file  Stress: Not on file  Social Connections: Not on file  Intimate Partner Violence: Not on file    Review of Systems  Cardiovascular:  Negative for chest pain.  Psychiatric/Behavioral:  Negative for suicidal ideas. The patient is nervous/anxious. The patient  does not have insomnia.   All other systems reviewed and are negative.       Objective    There were no vitals taken for this visit.  Physical Exam Vitals and nursing note reviewed.  Constitutional:      General: She is not in acute distress. Pulmonary:     Effort: Pulmonary effort is normal.     Breath sounds: Normal breath sounds.  Neurological:     Mental Status: She is alert.  Psychiatric:        Mood and Affect: Mood is anxious.        Speech: Speech normal.        Behavior:  Behavior normal.        Thought Content: Thought content normal.        Cognition and Memory: Cognition normal.        Judgment: Judgment normal.         Assessment & Plan:   Problem List Items Addressed This Visit   None Visit Diagnoses     Fatigue, unspecified type    -  Primary   Anxiety       Relevant Medications   escitalopram (LEXAPRO) 10 MG tablet   Other Relevant Orders   Ambulatory referral to Psychology      Anxiety - referral for talk therapy discussed, ordered - pt is stable on med, has been on for >3 yrs - no si/hi - refilled lexapro  Fatigue - offered labs and pt declined - likely has to do with pt working late at night - advised better sleep  Return in about 6 months (around 02/15/2023) for anxiety med discussion.   Beth Salter, MD

## 2022-08-19 ENCOUNTER — Other Ambulatory Visit: Payer: Self-pay | Admitting: Family Medicine

## 2023-01-05 ENCOUNTER — Telehealth: Payer: Self-pay | Admitting: Family Medicine

## 2023-04-10 ENCOUNTER — Encounter (HOSPITAL_COMMUNITY): Payer: Self-pay | Admitting: Behavioral Health

## 2023-04-10 ENCOUNTER — Ambulatory Visit (HOSPITAL_COMMUNITY)
Admission: EM | Admit: 2023-04-10 | Discharge: 2023-04-10 | Disposition: A | Discharge status: Home / Self Care | Payer: No Payment, Other | Attending: Behavioral Health | Admitting: Behavioral Health

## 2023-04-10 ENCOUNTER — Encounter (HOSPITAL_COMMUNITY): Payer: Self-pay | Admitting: Psychiatry

## 2023-04-10 DIAGNOSIS — F331 Major depressive disorder, recurrent, moderate: Secondary | ICD-10-CM | POA: Insufficient documentation

## 2023-04-10 DIAGNOSIS — R45851 Suicidal ideations: Secondary | ICD-10-CM | POA: Insufficient documentation

## 2023-04-10 DIAGNOSIS — F419 Anxiety disorder, unspecified: Secondary | ICD-10-CM | POA: Insufficient documentation

## 2023-04-10 DIAGNOSIS — F4323 Adjustment disorder with mixed anxiety and depressed mood: Secondary | ICD-10-CM | POA: Insufficient documentation

## 2023-04-10 DIAGNOSIS — F339 Major depressive disorder, recurrent, unspecified: Secondary | ICD-10-CM | POA: Insufficient documentation

## 2023-04-10 DIAGNOSIS — Z5902 Unsheltered homelessness: Secondary | ICD-10-CM | POA: Insufficient documentation

## 2023-04-10 NOTE — Progress Notes (Signed)
.    04/10/23 1502  [facility] Triage Screening (Walk-ins at [facility] only)  How Did You Hear About [place]? Self  What Is the Reason for Your Visit/Call Today? Pt is a 32 yo female who presents to [facility] voluntarily requesting medication refill. Pt reports that in April 2024 she lost her job and has not been able to afford her medication. Pt reports that she struggles with depression and anxiety. Pt reports that she is prescribed Lexapro. Pt reports that she is currently seeing a therapist once a week through Talk Therapy. Pt reports passive SI thoughts with no plan. Pt states, " I will never hurt myself but I am in a lot of pain and I have thoughts of wishing something would happen to me so I don't feel pain anymore." Pt denies HI, AVH, and paranoia. Pt is currently homeless. Pt denies ETOH and drug use.  How Long Has This Been Causing You Problems? 1-6 months  Have You Recently Had Any Thoughts About Hurting Yourself? Yes  How long ago did you have thoughts about hurting yourself? past 2 weeks Passive SI thoughts  Are You Planning to Commit Suicide/Harm Yourself At This time? No  Have you Recently Had Thoughts About Hurting Someone Karolee Ohs? No  Are You Planning To Harm Someone At This Time? No  Are you currently experiencing any auditory, visual or other hallucinations? No  Have You Used Any Alcohol or Drugs in the Past 24 Hours? No  Do you have any current medical co-morbidities that require immediate attention? No  Clinician description of patient physical appearance/behavior: Pt is tearful and presents anxious. Pt was cooperative throughout the assessment.  What Do You Feel Would Help You the Most Today? Treatment for Depression or other mood problem;Stress Management;Medication(s)  If access to [facility] was not available, would you have sought care in the Emergency Department? Yes  Determination of Need Routine (7 days)  Options For Referral Medication Management;Outpatient Therapy    Flowsheet Row ED  from 04/10/2023 in [facility]  C-SSRS RISK CATEGORY Low Risk

## 2023-04-10 NOTE — ED Notes (Signed)
Patient was discharged to home by provider. Patient was given an AVS with community resources.  

## 2023-04-10 NOTE — ED Provider Notes (Signed)
Behavioral Health Urgent Care Medical Screening Exam  Patient Name: Beth [name] MRN: [id number] Date of Evaluation: 04/10/23 Chief Complaint:  "My anxiety has been bad" Diagnosis:  Final diagnoses:  Adjustment disorder with mixed anxiety and depressed mood  Moderate episode of recurrent major depressive disorder (HCC)   History of Present Illness: Beth [name] is a 32 y.o. female patient with a past psychiatric history of anxiety and depression who presented to [facility] voluntarily and accompanied by her family friend Elnita Maxwell Ryding) for a walk-in assessment with complaints of worsening depression and anxiety and requesting a medication refill. Patient requested for Elnita Maxwell to remain in lobby during assessment but did give verbal consent for provider to speak with Elnita Maxwell for collateral information and safety planning after assessment.  Patient assessed face-to-face by this provider, consulted with Dr. Viviano Simas, and chart reviewed on 04/10/23. On evaluation, Beth [name] is seated in assessment area in no acute distress. Patient is alert and oriented x4, cooperative and pleasant. Patient appears disheveled. Speech is clear and coherent, normal rate and volume. Eye contact is good. Mood is depressed and anxious with congruent affect. Thought process is coherent. Patient reports she has been experiencing passive suicidal ideations with no plan or intent for a couple of weeks. Patient states "I wish something would happen to me but I would never do anything to harm myself." Patient is able to verbally contract for safety. Patient denies homicidal ideations. Patient denies a history of suicide attempts or self-harm. Patient denies past psychiatric hospitalizations. Patient denies auditory and visual hallucinations. Patient denies symptoms of paranoia. Patient is able to converse coherently with goal-directed thoughts and no distractibility or preoccupation. Objectively, there is no evidence of psychosis/mania,  delusional thinking, or indication that patient is responding to internal or external stimuli.  Patient reports poor sleep (4-5 hours/night) and decreased appetite. Patient reports she lost her job in April 2024 and is currently homeless. 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 Patient denies access to weapons/firearms. Patient denies use of alcohol or illicit substances. Patient states she was being prescribed Lexapro by her PCP "for years" but that her PCP went to travel and she did not like the provider who took over her care, so she stopped going. Per chart review, patient received a 49-month prescription for Lexparo 10mg  daily in November 2023 by Dr. Melynda Ripple at [facility]. Patient reports last taking Lexparo in April 2024. Patient reports increased anxiety and depression since stopping her medication related to her current situation stating "a lot has happened to me." Patient reports she has weekly visits with Talk Therapy but doesn't find them helpful.   Spoke with family friend Elnita Maxwell Riding who shares that patient's anxiety and depression have increased since losing her job, being homeless and living out of her car, and having a tense relationship with her mother and family. Elnita Maxwell denies any safety concerns for patient being discharged today and is hopeful patient can get outpatient psychiatric resources for therapy and medication management.   Patient offered support and encouragement. Discussed with patient and Elnita Maxwell and provided information in AVS of [facility] outpatient services walk-in hours for therapy and medication management. Patient states she plans to come during walk-in hours tomorrow morning. Discussed methods to reduce the risk of self-injury or suicide attempts, such as frequent conversations regarding unsafe thoughts. Discussed with patient and Elnita Maxwell following up with additional outpatient psychiatric resources provided in AVS for therapy and medication  management and they are in agreement with plan of  care.   At this time, Beth [name] is educated and verbalizes understanding of mental health resources and other crisis services in the community. She is instructed to call 911 and present to the nearest emergency room should she experience any suicidal/homicidal ideation, auditory/visual/hallucinations, or detrimental worsening of her mental health condition.    Flowsheet Row ED from 04/10/2023 in [facility]  C-SSRS RISK CATEGORY Low Risk       Psychiatric Specialty Exam  Presentation  General Appearance:Appropriate for Environment; Disheveled  Eye Contact:Good  Speech:Clear and Coherent; Normal Rate  Speech Volume:Normal  Handedness:Right   Mood and Affect  Mood: Depressed; Anxious  Affect: Congruent   Thought Process  Thought Processes: Coherent; Goal Directed  Descriptions of Associations:Intact  Orientation:Full (Time, Place and Person)  Thought Content:Logical    Hallucinations:None  Ideas of Reference:None  Suicidal Thoughts:Yes, Passive Without Intent; Without Plan; Without Means to Carry Out; Without Access to Means  Homicidal Thoughts:No   Sensorium  Memory: Immediate Good; Recent Good; Remote Good  Judgment: Fair  Insight: Fair   Executive Functions  Concentration: Good  Attention Span: Good  Recall: Good  Fund of Knowledge: Good  Language: Good   Psychomotor Activity  Psychomotor Activity: Normal   Assets  Assets: Communication Skills; Desire for Improvement; Financial Resources/Insurance; Leisure Time; Physical Health; Resilience; Transportation; Social Support   Sleep  Sleep: Poor  Number of hours:  0 (4-5)   Physical Exam: Physical Exam Vitals and nursing note reviewed.  Constitutional:      General: She is not in acute distress.    Appearance: Normal appearance. She is not ill-appearing.  HENT:     Head: Normocephalic  and atraumatic.     Nose: Nose normal.  Eyes:     General:        Right eye: No discharge.        Left eye: No discharge.     Conjunctiva/sclera: Conjunctivae normal.  Cardiovascular:     Rate and Rhythm: Normal rate.  Pulmonary:     Effort: Pulmonary effort is normal. No respiratory distress.  Musculoskeletal:        General: Normal range of motion.     Cervical back: Normal range of motion.  Skin:    General: Skin is warm and dry.  Neurological:     General: No focal deficit present.     Mental Status: She is alert and oriented to person, place, and time. Mental status is at baseline.  Psychiatric:        Attention and Perception: Attention and perception normal.        Mood and Affect: Mood is anxious and depressed.        Speech: Speech normal.        Behavior: Behavior normal. Behavior is cooperative.        Thought Content: Thought content is not paranoid or delusional. Thought content includes suicidal (Passive, no plan/intent) ideation. Thought content does not include homicidal ideation. Thought content does not include homicidal or suicidal plan.        Cognition and Memory: Cognition and memory normal.        Judgment: Judgment normal.    Review of Systems  Constitutional: Negative.   HENT: Negative.    Eyes: Negative.   Respiratory: Negative.    Cardiovascular: Negative.   Gastrointestinal: Negative.   Genitourinary: Negative.   Musculoskeletal: Negative.   Skin: Negative.   Neurological: Negative.   Endo/Heme/Allergies: Negative.   Psychiatric/Behavioral:  Positive for depression and suicidal ideas (Passive, no plan/intent). Negative for hallucinations, memory loss and substance abuse. The patient is nervous/anxious and has insomnia.    Blood pressure 126/79, pulse 74, temperature 98.3 F (36.8 C), temperature source Oral, resp. rate 18, SpO2 100 %. There is no height or weight on file to calculate BMI.  Musculoskeletal: Strength & Muscle Tone: within  normal limits Gait & Station: normal Patient leans: N/A   [facility] MSE Discharge Disposition for Follow up and Recommendations: Based on my evaluation the patient does not appear to have an emergency medical condition and can be discharged with resources and follow up care in outpatient services for Medication Management and Individual Therapy   Sunday Corn, NP 04/10/2023, 6:10 PM

## 2023-04-10 NOTE — Discharge Instructions (Addendum)
[facility]: Outpatient psychiatric Services:   Please see the walk in hours listed below.  Medication Management New Patient needing Medication Management Walk-in, and Existing Patients needing to see a provider for management coming as a walk in   Monday thru Friday 8:00 AM first come first serve until slots are full.  Recommend being there by 7:15 AM to ensure a slot is open.  Therapy New Patient Therapy Intake and Existing Patients needing to see therapist coming in as a walk in.   Monday, Wednesday, and Thursday morning at 8:00 am first come first serve.  Recommend being there by 7:15 AM to ensure a slot is open.    Every 1st, 2nd, and 3rd Friday at 1:00 PM first come first serve until slots are full.  Will still need to come in that morning at 7:15 AM to get registered for an afternoon slot.  For all walk-ins we ask that you arrive by 7:15 am because patients will be seen in there order of arrival (FIRST COME FIRST SERVE) Availability is limited, therefore you may not be seen on the same day that you walk in if all slots are full.    Our goal is to serve and meet the needs of our community to the best of our ability.     Based on what you have shared, a list of resources for outpatient therapy and psychiatry is provided below to get you started back on treatment.  It is imperative that you follow through with treatment within 5-7 days from the day of discharge to prevent any further risk to your safety or mental well-being.  You are not limited to the list provided.  In case of an urgent crisis, you may contact the Mobile Crisis Unit with [facility] at [phone].        Outpatient Services for Therapy and Medication Management  [facility] [address], [place], [address] [phone] phone  [facility] [address] [place], [place], [address] [phone] phone  [facility] [address] [place], [place], [address] [phone] phone [phone] phone ([address], [address], Anthem/Elevance, [address], [address], [address], [address], Healthy [place], [place], [place], [address], [employer]oco[employer]ill[employer], [place], UHC, American Financial, [place], Out of Network)  [facility], [place] [address] [place], [place], [address] [phone] phone ([place], Anthem/Elevance, San[employer]SCI Options/Carelon, BCBS, [address], [place], [place], [place], [place], [employer], [place], [place], [place], [facility])  [facility] [address] [place], [place], [address] [phone] phone (Medicaid, ask about other insurance)  [facility] [address] [place], [place], [address] [phone] phone [phone] fax ([address], [place] , [place], [place], [place] Health Choice, UHC, General Electric, Self-Pay)  [facility] [address] [place], [place], [address] [phone] phone ([address] Anthem/Elevance, [address], [address], [place], [employer]X Corporation, [place], [place], [place], [employer], [place], [place], [place], [facility])  [facility] - 6-8 MONTH WAIT FOR THERAPY; [facility] FOR MEDICATION MANAGEMENT [address], [address] [place], [place], [address] [phone] phone ([address], AmeriHealth [address] - [place], [address], [place], [place], Friday Health Plans, [address], BCBS Healthy [place], [place], [address], [place], [place], [place], [place], Tricare, UHC, Safeco Corporation, [place])  [facility] [address] [place], [place], [address] [phone] phone  [facility] [address], [address] [place], [place], [address] [phone] phone  [facility] [address] [place], [place], [address] [phone] phone  [facility] - THERAPY ONLY [address], [place], [address] [phone] phone  [facility], [place] [address], [place], [address] [phone] phone  [facility] [address] [place], [place], [address] [phone] phone [phone] fax  [facility] [address] [place], [place], [address] [phone] phone [phone] fax  [facility] [phone] [address] [place], [place], [address] [phone] phone  [facility] [address] [place], [place], [address]  [phone] phone  [facility]  (Adults Only) [address] [place], [place], [address]  [phone] phone [phone] fax

## 2023-04-11 ENCOUNTER — Ambulatory Visit (INDEPENDENT_AMBULATORY_CARE_PROVIDER_SITE_OTHER): Payer: No Payment, Other | Admitting: Family

## 2023-04-11 ENCOUNTER — Encounter (HOSPITAL_COMMUNITY): Payer: Self-pay

## 2023-04-11 ENCOUNTER — Ambulatory Visit (INDEPENDENT_AMBULATORY_CARE_PROVIDER_SITE_OTHER): Payer: No Payment, Other | Admitting: Clinical

## 2023-04-11 DIAGNOSIS — F330 Major depressive disorder, recurrent, mild: Secondary | ICD-10-CM

## 2023-04-11 DIAGNOSIS — F331 Major depressive disorder, recurrent, moderate: Secondary | ICD-10-CM

## 2023-04-11 MED ORDER — ESCITALOPRAM OXALATE 10 MG PO TABS: 10.0000 mg | ORAL_TABLET | Freq: Every day | ORAL | 0 refills | Status: DC

## 2023-04-11 NOTE — Progress Notes (Addendum)
Psychiatric Initial Adult Assessment   Patient Identification: Beth [name] MRN:  [id number] Date of Evaluation:  04/11/2023 Referral Source: Walk in  Chief Complaint: Depression and Anxiety   Visit Diagnosis:    ICD-10-CM   1. Mild episode of recurrent major depressive disorder (HCC)  F33.0       History of Present Illness: Beth [name] 32 year old Caucasian female who presents to establish care.  States she was followed by therapy and psychiatry roughly 9 months ago.  States she was seen by Dr. Melynda Ripple and her last virtual visit was by Sheryle Hail at [facility].  States she is prescribed Lexapro 10 mg  which she has been taking and tolerating well.  States her last dose was January/2023.   Beth [name] a history of major depressive disorder and generalized anxiety disorder.  [name] multiple stressors related to financial concerns states she is homeless since April 16.  States a recent job loss.  [name] ongoing symptoms related to insomnia, symptoms of worry, and worsening depression.  [name] intermittent suicidal ideations.  Denying plan or intent. PHQ9=15, GAD 7=5. Patient is denying any possibility of pregnancy.   [name] a strained relationship between she and her parents.  [name] her father told her to get out of their home and she has she has been " figuring things out."  States she currently resides in a car.  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 "  I enjoyed people so I think this is going to be a great change for me."  Denies that she has contact with her siblings.  States that her parents have 4 children.  Unsure family mental illness status.  States her mother may have been diagnosed with anxiety however stated that " our  family does not talk about feelings."   Beth [name] is sitting.  she is alert/oriented x 4; calm/cooperative; and mood congruent with affect.  Patient is speaking in a clear tone at moderate volume, and normal pace;  with fair eye contact. Her thought process is coherent and relevant; There is no indication that she is currently responding to internal/external stimuli or experiencing delusional thought content.  Patient denies suicidal/self-harm/homicidal ideation, psychosis, and paranoia.  Patient has remained calm throughout assessment and has answered questions appropriately.   Associated Signs/Symptoms: Depression Symptoms:  depressed mood, difficulty concentrating, suicidal thoughts without plan, anxiety, (Hypo) Manic Symptoms:  Distractibility, Irritable Mood, Anxiety Symptoms:  Excessive Worry, Psychotic Symptoms:  Hallucinations: None PTSD Symptoms: NA  Past Psychiatric History: reported history with depression anxiety.  [name] she was prescribed Lexapro in the past.  Denied previous inpatient admissions.  Denied illicit drug use or substance abuse history.  Previous Psychotropic Medications: Yes   Substance Abuse History in the last 12 months:  No.  Consequences of Substance Abuse: NA  Past Medical History:  Past Medical History:  Diagnosis Date   Anxiety     Past Surgical History:  Procedure Laterality Date   CHOLECYSTECTOMY Left 01/24/2019   Procedure: LAPAROSCOPIC CHOLECYSTECTOMY WITH INTRAOPERATIVE CHOLANGIOGRAM;  Surgeon: Manus Rudd, MD;  Location: [facility] OR;  Service: General;  Laterality: Left;    Family Psychiatric History:   Family History:  Family History  Problem Relation Age of Onset   Depression Maternal Grandmother    Depression Maternal Grandfather     Social History:   Social History   Socioeconomic History   Marital status: Single    Spouse name: Not on file   Number of children: Not  on file   Years of education: Not on file   Highest education level: Not on file  Occupational History   Not on file  Tobacco Use   Smoking status: Never   Smokeless tobacco: Never   Tobacco comments:    [place]  Vaping Use   Vaping Use: Former  Substance and Sexual  Activity   Alcohol use: Yes    Comment: social   Drug use: Never   Sexual activity: Not on file  Other Topics Concern   Not on file  Social History Narrative   Not on file   Social Determinants of Health   Financial Resource Strain: Not on file  Food Insecurity: Not on file  Transportation Needs: Not on file  Physical Activity: Not on file  Stress: Not on file  Social Connections: Not on file    Additional Social History:   Allergies:  No Known Allergies  Metabolic Disorder Labs: No results found for: "HGBA1C", "MPG" No results found for: "PROLACTIN" No results found for: "CHOL", "TRIG", "HDL", "CHOLHDL", "VLDL", "LDLCALC" No results found for: "TSH"  Therapeutic Level Labs: No results found for: "LITHIUM" No results found for: "CBMZ" No results found for: "VALPROATE"  Current Medications: Current Outpatient Medications  Medication Sig Dispense Refill   acetaminophen (TYLENOL) 325 MG tablet Take 2 tablets (650 mg total) by mouth every 6 (six) hours as needed for mild pain (or temp > 100).     escitalopram (LEXAPRO) 10 MG tablet Take 1 tablet (10 mg total) by mouth daily. 60 tablet 0   ibuprofen (ADVIL) 200 MG tablet Take 3 tablets (600 mg total) by mouth every 6 (six) hours as needed for moderate pain.     oxyCODONE (OXY IR/ROXICODONE) 5 MG immediate release tablet Take 1-2 tablets (5-10 mg total) by mouth every 6 (six) hours as needed for severe pain. 15 tablet 0   No current facility-administered medications for this visit.    Musculoskeletal: Strength & Muscle Tone: within normal limits Gait & Station: normal Patient leans: N/A  Psychiatric Specialty Exam: Review of Systems  Cardiovascular: Negative.   Psychiatric/Behavioral:  Positive for sleep disturbance. Negative for hallucinations. Suicidal ideas: passive ideaions.The patient is nervous/anxious.   All other systems reviewed and are negative.   There were no vitals taken for this visit.There is no  height or weight on file to calculate BMI.  General Appearance: Casual  Eye Contact:  Good  Speech:  Clear and Coherent  Volume:  Normal  Mood:  Anxious and Depressed  Affect:  Congruent  Thought Process:  Coherent  Orientation:  Full (Time, Place, and Person)  Thought Content:  Logical  Suicidal Thoughts:  No passive ideaitons denies plan or intent  Homicidal Thoughts:  No  Memory:  Immediate;   Good Recent;   Good  Judgement:  Good  Insight:  Good  Psychomotor Activity:  Normal  Concentration:  Concentration: Good  Recall:  Good  Fund of Knowledge:Good  Language: Good  Akathisia:  No  Handed:  Right  AIMS (if indicated):  not done  Assets:  Communication Skills Desire for Improvement Resilience Social Support  ADL's:  Intact  Cognition: WNL  Sleep:  Good   Screenings: PHQ2-9    Flowsheet Row Office Visit from 04/11/2023 in [facility]  PHQ-2 Total Score 4  PHQ-9 Total Score 15      Flowsheet Row Office Visit from 04/11/2023 in [facility] ED from 04/10/2023 in [facility]  C-SSRS RISK CATEGORY Error: Question 1 not populated Low Risk       Assessment and Plan:  Beth [name] 32 year old Caucasian female presents with a history of reported depression and anxiety.  [name] she was followed by therapy and psychiatry services.  States she was taking Lexapro 10 mg daily which helped stabilize her mood.  She [name] suicidal ideations denying plan or intent.  She [name] multiple psychosocial stressors which has caused a decline in her mental health.  Discussed reestablishing care.  Will reinitiate Lexapro 10 mg daily.  Patient to follow-up 3 months.  Patient has a new outpatient appointment for therapy services at the [facility] urgent care outpatient facility.  Continue to monitor symptoms.  Support, encouragement reassurance was provided.  -Patient has a therapy appointment  at  10:00 - Follow-up  3 months -Take all of you medications as prescribed by your mental healthcare provider.  Report any adverse effects and reactions from your medications to your outpatient provider promptly.  Do not engage in alcohol and or illegal drug use while on prescription medicines. Keep all scheduled appointments. This is to ensure that you are getting refills on time and to avoid any interruption in your medication.  If you are unable to keep an appointment call to reschedule.  Be sure to follow up with resources and follow ups given. In the event of worsening symptoms call the crisis hotline, 911, and or go to the nearest emergency department for appropriate evaluation and treatment of symptoms. Follow-up with your primary care provider for your medical issues, concerns and or health care needs.    Collaboration of Care: Medication Management AEB Lexapro  Patient/Guardian was advised Release of Information must be obtained prior to any record release in order to collaborate their care with an outside provider. Patient/Guardian was advised if they have not already done so to contact the registration department to sign all necessary forms in order for [place] to release information regarding their care.   Consent: Patient/Guardian gives verbal consent for treatment and assignment of benefits for services provided during this visit. Patient/Guardian expressed understanding and agreed to proceed.   Oneta Rack, NP 7/10/20249:15 AM

## 2023-04-11 NOTE — Progress Notes (Signed)
Comprehensive Clinical Assessment (CCA) Note  04/11/2023 Beth [name] [id number]  Chief Complaint:  Chief Complaint  Patient presents with   Depression   Anxiety   Visit Diagnosis:   Major depressive disorder, recurrent episode, moderate with anxious distress   Interpretive Summary:  Client is a 32 year old female presenting to the [facility] as a walk in for outpatient services. Client is presenting by referral of the [facility] urgent care for follow up outpatient services. Client reported she presented to [facility] urgent care on 04/10/23 and triaged for depression and requesting to establish with a provider to refill her medication. Client reported she was previously seeing a therapist for talk therapy weekly but stopped once she left her previous employer and could not afford the service. Client reported she has been homeless since April 2024 and is recently started a new job. Client reported she has no family and/or social support. Client reported she has had symptoms for anxiety since childhood but her parents did not seek help for her. Client reported anxiety consumes most of her life currently. Client reported passive suicidal ideations in the past but no plan and/or intent of harming herself. Client denied history of hospitalization for mental health reasons. Client denied illicit substance use. Client presented oriented times five, appropriately dressed and friendly. Client denied hallucinations, delusions, suicidal and homicidal ideations. Client was screened for pain, nutrition, [place] suicide severity and the following SDOH:    04/11/2023   10:19 AM  GAD 7 : Generalized Anxiety Score  Nervous, Anxious, on Edge 3  Control/stop worrying 3  Worry too much - different things 3  Trouble relaxing 3  Restless 3  Easily annoyed or irritable 3  Afraid - awful might happen 3  Total GAD 7 Score 21  Anxiety Difficulty Extremely difficult     Flowsheet Row  Office Visit from 04/11/2023 in [place]  PHQ-9 Total Score 15        Treatment recommendations: counseling and medication management via [facility]  Therapist provided information on format of appointment (virtual or face to face).   The client was advised to call back or seek an in-person evaluation if the symptoms worsen or if the condition fails to improve as anticipated before the next scheduled appointment. Client was in agreement with treatment recommendations.   CCA Biopsychosocial Intake/Chief Complaint:  client reported previous history of therapy for major depression and anxiety.  Current Symptoms/Problems: client reported feeling down, worried, some paranoia, stress, difficulty sleeping anxious  Patient Reported Schizophrenia/Schizoaffective Diagnosis in Past: No  Strengths: actively seeking to engage in outpatient services  Preferences: counseling and psychiatry  Abilities: discussing problems and needs  Type of Services Patient Feels are Needed: psychaitry and counseling  Initial Clinical Notes/Concerns: No data recorded  Mental Health Symptoms Depression:   Change in energy/activity   Duration of Depressive symptoms:  Greater than two weeks   Mania:   None   Anxiety:    Difficulty concentrating; Restlessness; Sleep; Tension; Worrying   Psychosis:   None   Duration of Psychotic symptoms: No data recorded  Trauma:   None   Obsessions:   None   Compulsions:   None   Inattention:   None   Hyperactivity/Impulsivity:   None   Oppositional/Defiant Behaviors:   None   Emotional Irregularity:   None   Other Mood/Personality Symptoms:  No data recorded   Mental Status Exam Appearance and self-care  Stature:   Tall   Weight:  Average weight   Clothing:   Casual   Grooming:   Normal   Cosmetic use:   Age appropriate   Posture/gait:   Normal   Motor activity:   Not Remarkable   Sensorium   Attention:   Normal   Concentration:   Normal   Orientation:   X5   Recall/memory:   Normal   Affect and Mood  Affect:   Depressed   Mood:   Depressed   Relating  Eye contact:   Normal   Facial expression:   Responsive   Attitude toward examiner:   Cooperative   Thought and Language  Speech flow:  Clear and Coherent   Thought content:   Appropriate to Mood and Circumstances   Preoccupation:   None   Hallucinations:   None   Organization:  No data recorded  [employer] of Knowledge:   Good   Intelligence:   Average   Abstraction:   Normal   Judgement:   Good   Reality Testing:   Adequate   Insight:   Good   Decision Making:   Normal   Social Functioning  Social Maturity:   Responsible   Social Judgement:   Normal   Stress  Stressors:   Family conflict; Housing   Coping Ability:   Deficient supports   Skill Deficits:   Self-care; Communication; Activities of daily living   Supports:   Support needed     Religion: Religion/Spirituality Are You A Religious Person?: No  Leisure/Recreation: Leisure / Recreation Do You Have Hobbies?: No  Exercise/Diet: Exercise/Diet Do You Exercise?: No Have You Gained or Lost A Significant Amount of Weight in the Past Six Months?: No Do You Follow a Special Diet?: No Do You Have Any Trouble Sleeping?: Yes   CCA Employment/Education Employment/Work Situation: Employment / Work Situation Employment Situation: Employed 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 client reported she was raised by her parents. Patient's description of current relationship with people who raised him/her: client reported she does not have a good relationship with her family at this time. Does patient have siblings?: Yes Number of Siblings: 3 Description of patient's current relationship with siblings: Client reported she has brothers whom she does not speak with. client reported her older brother seems to be narcisstist and does not get along with him. client reported she does not want her other 2 brothers knowing something is wrong with her. Did patient suffer any verbal/emotional/physical/sexual abuse as a child?: No Did patient suffer from severe childhood neglect?: No Has patient ever been sexually abused/assaulted/raped as an adolescent or adult?: No Was the patient ever a victim of a crime or a disaster?: No Witnessed domestic violence?: No Has patient been affected by domestic violence as an adult?: No  Child/Adolescent Assessment:     CCA Substance Use Alcohol/Drug Use: Alcohol / Drug Use History of alcohol / drug use?: No history of alcohol / drug abuse  ASAM's:  Six Dimensions of Multidimensional Assessment  Dimension 1:  Acute Intoxication and/or Withdrawal Potential:      Dimension 2:  Biomedical Conditions and Complications:      Dimension 3:  Emotional, Behavioral, or Cognitive Conditions and Complications:     Dimension 4:  Readiness to Change:     Dimension 5:  Relapse, Continued use, or Continued Problem Potential:     Dimension 6:  Recovery/Living Environment:     ASAM Severity Score:    ASAM Recommended Level of Treatment:     Substance use Disorder (SUD)    Recommendations for Services/Supports/Treatments: Recommendations for Services/Supports/Treatments Recommendations For Services/Supports/Treatments: Medication Management,  Individual Therapy  DSM5 Diagnoses: Patient Active Problem List   Diagnosis Date Noted   Adjustment disorder with mixed anxiety and depressed mood 04/10/2023   MDD (major depressive disorder), recurrent episode (HCC) 04/10/2023   Acute cholecystitis 01/23/2019    Patient Centered Plan: Patient is on the following Treatment Plan(s):  Depression   Referrals to Alternative Service(s): Referred to Alternative Service(s):   Place:   Date:   Time:    Referred to Alternative Service(s):   Place:   Date:   Time:    Referred to Alternative Service(s):   Place:   Date:   Time:    Referred to Alternative Service(s):   Place:   Date:   Time:      Collaboration of Care: Medication Management AEB [facility]  Patient/Guardian was advised Release of Information must be obtained prior to any record release in order to collaborate their care with an outside provider. Patient/Guardian was advised if they have not already done so to contact the registration department to sign all necessary forms in order for [place] to release information regarding their care.   Consent: Patient/Guardian gives verbal consent for treatment and assignment of benefits for services provided during this visit. Patient/Guardian expressed understanding and agreed to proceed.   Neena Rhymes Lennix Kneisel, LCSW

## 2023-06-05 ENCOUNTER — Ambulatory Visit (HOSPITAL_COMMUNITY): Payer: No Payment, Other | Admitting: Clinical

## 2023-06-08 ENCOUNTER — Other Ambulatory Visit (HOSPITAL_COMMUNITY): Payer: Self-pay | Admitting: Family

## 2023-06-26 ENCOUNTER — Ambulatory Visit (HOSPITAL_COMMUNITY): Payer: No Payment, Other | Admitting: Clinical

## 2023-07-10 NOTE — Progress Notes (Deleted)
Psychiatric Initial Adult Assessment  Patient Identification: Beth [name] MRN:  [id number] Date of Evaluation:  07/10/2023 Referral Source: Walk in  Assessment:  Beth [name] is a 32 y.o. female with a history of MDD and GAD who presents in person to [facility] for initial evaluation of depression and anxiety. She is here for a three month follow up with OP psychiatry, previously saw Hillery Jacks, NP. Patient reports ***  Plan:  # MDD Past medication trials:  Status of problem: *** Interventions: -- ***  # GAD Past medication trials:  Status of problem: *** Interventions: -- ***  # *** Past medication trials:  Status of problem: *** Interventions: -- ***  Patient was given contact information for behavioral health clinic and was instructed to call 911 for emergencies.   Subjective:  Chief Complaint: No chief complaint on file.   History of Present Illness:    Beth [name] is a 32 y.o. female with a history of MDD and GAD who presents in person to [facility] for initial evaluation of depression and anxiety. She is here for a three month follow up with OP psychiatry, previously saw Hillery Jacks, NP.  She is currently taking Lexapro 10 mg.   ***  Psychiatric ROS Mood Symptoms Persistent sadness or low mood; loss of interest or pleasure in activities (anhedonia); significant weight change or appetite disturbance; sleep disturbances (insomnia or hypersomnia); fatigue or loss of energy; feelings of worthlessness or excessive guilt; difficulty concentrating or making decisions; recurrent thoughts of death or suicide. *** Manic Symptoms Elevated or irritable mood; increased self-esteem or grandiosity; decreased need for sleep; more talkative than usual or pressure to keep talking; flight of ideas or subjective experience of racing thoughts; distractibility; increase in goal-directed activity or psychomotor agitation; excessive  involvement in activities with high potential for painful consequences (e.g., spending sprees, sexual indiscretions). *** Anxiety Symptoms Excessive anxiety and worry occurring more days than not; difficulty controlling the worry; restlessness or feeling keyed up or on edge; being easily fatigued; difficulty concentrating or mind going blank; irritability; muscle tension; sleep disturbance (difficulty falling or staying asleep, or restless, unsatisfying sleep); recurrent unexpected panic attacks; palpitations, pounding heart, or accelerated heart rate; sweating; trembling or shaking; sensations of shortness of breath or smothering; feelings of choking; chest pain or discomfort; nausea or abdominal distress; feeling dizzy, unsteady, lightheaded, or faint; chills or heat sensations; paresthesias (numbness or tingling sensations); derealization or depersonalization; fear of losing control or "going crazy"; fear of dying. *** Trauma Symptoms Exposure to actual or threatened death, serious injury, or sexual violence; intrusive symptoms (e.g., flashbacks, distressing memories, or dreams); avoidance of stimuli associated with the trauma; negative alterations in cognitions and mood (e.g., inability to remember aspects of the trauma, persistent negative beliefs, or emotional numbing); marked alterations in arousal and reactivity (e.g., hypervigilance, exaggerated startle response, irritability, or sleep disturbance) *** Psychosis Symptoms Delusions (false beliefs firmly held despite evidence to the contrary); hallucinations (perceptual experiences without external stimuli, typically auditory); disorganized thinking (inferred from disorganized speech); grossly disorganized or abnormal motor behavior (including catatonia); negative symptoms (e.g., diminished emotional expression, avolition, alogia, anhedonia, asociality).   Past Psychiatric History:  Diagnoses: MDD Medication trials: Lexapro Previous  psychiatrist/therapist: Hillery Jacks, NP last say in July 2024; last saw therapist in July 2024 Hospitalizations: Denies Suicide attempts: *** SIB: *** Hx of violence towards others: *** Current access to guns: *** Hx of trauma/abuse: ***  Substance Abuse History in the last 12 months:  {yes  [id number]}  Past Medical History:  Past Medical History:  Diagnosis Date   Anxiety     Past Surgical History:  Procedure Laterality Date   CHOLECYSTECTOMY Left 01/24/2019   Procedure: LAPAROSCOPIC CHOLECYSTECTOMY WITH INTRAOPERATIVE CHOLANGIOGRAM;  Surgeon: Manus Rudd, MD;  Location: [facility] OR;  Service: General;  Laterality: Left;    Family Psychiatric History: Unsure  Family History:  Family History  Problem Relation Age of Onset   Depression Maternal Grandmother    Depression Maternal Grandfather     Social History:   Living: *** School: *** Job: *** Married/Children: *** Support: *** Smoking: *** Alcohol: *** Illicit drugs: *** Legal History: ***   Social History   Socioeconomic History   Marital status: Single    Spouse name: Not on file   Number of children: Not on file   Years of education: Not on file   Highest education level: Not on file  Occupational History   Not on file  Tobacco Use   Smoking status: Never   Smokeless tobacco: Never   Tobacco comments:    Huka  Vaping Use   Vaping status: Former  Substance and Sexual Activity   Alcohol use: Yes    Comment: social   Drug use: Never   Sexual activity: Not on file  Other Topics Concern   Not on file  Social History Narrative   Not on file   Social Determinants of Health   Financial Resource Strain: Not on file  Food Insecurity: Not on file  Transportation Needs: Not on file  Physical Activity: Not on file  Stress: Not on file  Social Connections: Not on file    Additional Social History: updated  Allergies:  No Known Allergies  Current Medications: Current Outpatient Medications   Medication Sig Dispense Refill   acetaminophen (TYLENOL) 325 MG tablet Take 2 tablets (650 mg total) by mouth every 6 (six) hours as needed for mild pain (or temp > 100).     escitalopram (LEXAPRO) 10 MG tablet Take 1 tablet (10 mg total) by mouth daily. 60 tablet 0   ibuprofen (ADVIL) 200 MG tablet Take 3 tablets (600 mg total) by mouth every 6 (six) hours as needed for moderate pain.     oxyCODONE (OXY IR/ROXICODONE) 5 MG immediate release tablet Take 1-2 tablets (5-10 mg total) by mouth every 6 (six) hours as needed for severe pain. 15 tablet 0   No current facility-administered medications for this visit.    ROS: Review of Systems  Objective:  Psychiatric Specialty Exam: There were no vitals taken for this visit.There is no height or weight on file to calculate BMI.  General Appearance: {Appearance:22683}  Eye Contact:  {BHH EYE CONTACT:22684}  Speech:  {Speech:22685}  Volume:  {Volume (PAA):22686}  Mood:  {BHH MOOD:22306}  Affect:  {Affect (PAA):22687}  Thought Content: {Thought Content:22690}   Suicidal Thoughts:  {ST/HT (PAA):22692}  Homicidal Thoughts:  {ST/HT (PAA):22692}  Thought Process:  {Thought Process (PAA):22688}  Orientation:  {BHH ORIENTATION (PAA):22689}    Memory:  Grossly intact ***  Judgment:  {Judgement (PAA):22694}  Insight:  {Insight (PAA):22695}  Concentration:  {Concentration:21399}  Recall:  not formally assessed ***  Fund of Knowledge: {BHH GOOD/FAIR/POOR:22877}  Language: {BHH GOOD/FAIR/POOR:22877}  Psychomotor Activity:  {Psychomotor (PAA):22696}  Akathisia:  {BHH YES OR NO:22294}  AIMS (if indicated): {Desc; done/not:10129}  Assets:  {Assets (PAA):22698}  ADL's:  {BHH VWU'J:81191}  Cognition: {chl bhh cognition:304700322}  Sleep:  {BHH GOOD/FAIR/POOR:22877}   PE: General: well-appearing; no acute distress ***  Pulm: no increased work of breathing on room air *** Strength & Muscle Tone: {desc; muscle tone:32375} Neuro: no focal  neurological deficits observed *** Gait & Station: {PE GAIT ED NATL:22525}  Metabolic Disorder Labs: No results found for: "HGBA1C", "MPG" No results found for: "PROLACTIN" No results found for: "CHOL", "TRIG", "HDL", "CHOLHDL", "VLDL", "LDLCALC" No results found for: "TSH"  Therapeutic Level Labs: No results found for: "LITHIUM" No results found for: "CBMZ" No results found for: "VALPROATE"  Screenings:  GAD-7    Flowsheet Row Counselor from 04/11/2023 in [facility]  Total GAD-7 Score 21      PHQ2-9    Flowsheet Row Office Visit from 04/11/2023 in [facility]  PHQ-2 Total Score 4  PHQ-9 Total Score 15      Flowsheet Row Office Visit from 04/11/2023 in [facility] ED from 04/10/2023 in [facility]  C-SSRS RISK CATEGORY Error: Question 1 not populated Low Risk       Collaboration of Care: Collaboration of Care: Primary Care Provider AEB    Patient/Guardian was advised Release of Information must be obtained prior to any record release in order to collaborate their care with an outside provider. Patient/Guardian was advised if they have not already done so to contact the registration department to sign all necessary forms in order for [place] to release information regarding their care.   Consent: Patient/Guardian gives verbal consent for treatment and assignment of benefits for services provided during this visit. Patient/Guardian expressed understanding and agreed to proceed.   A total of *** minutes was spent involved in face to face clinical care, chart review, documentation, and ***.   Lance Muss, MD 10/8/20245:21 PM

## 2023-07-11 ENCOUNTER — Encounter (HOSPITAL_COMMUNITY): Payer: No Payment, Other | Admitting: Psychiatry

## 2023-07-18 ENCOUNTER — Encounter (HOSPITAL_COMMUNITY): Payer: No Payment, Other | Admitting: Psychiatry

## 2023-09-04 ENCOUNTER — Inpatient Hospital Stay (HOSPITAL_BASED_OUTPATIENT_CLINIC_OR_DEPARTMENT_OTHER)
Admission: EM | Admit: 2023-09-04 | Discharge: 2023-09-07 | DRG: 163 | Disposition: A | Discharge status: Home / Self Care | Payer: Self-pay | Attending: Pulmonary Disease | Admitting: Pulmonary Disease

## 2023-09-04 ENCOUNTER — Inpatient Hospital Stay (HOSPITAL_COMMUNITY): Payer: Self-pay

## 2023-09-04 ENCOUNTER — Emergency Department (HOSPITAL_BASED_OUTPATIENT_CLINIC_OR_DEPARTMENT_OTHER): Payer: Self-pay

## 2023-09-04 ENCOUNTER — Other Ambulatory Visit (HOSPITAL_COMMUNITY): Payer: Self-pay

## 2023-09-04 ENCOUNTER — Other Ambulatory Visit: Payer: Self-pay

## 2023-09-04 DIAGNOSIS — I2609 Other pulmonary embolism with acute cor pulmonale: Secondary | ICD-10-CM

## 2023-09-04 DIAGNOSIS — I2699 Other pulmonary embolism without acute cor pulmonale: Principal | ICD-10-CM | POA: Diagnosis present

## 2023-09-04 DIAGNOSIS — J9601 Acute respiratory failure with hypoxia: Secondary | ICD-10-CM | POA: Diagnosis present

## 2023-09-04 DIAGNOSIS — N179 Acute kidney failure, unspecified: Secondary | ICD-10-CM | POA: Diagnosis present

## 2023-09-04 DIAGNOSIS — Z5902 Unsheltered homelessness: Secondary | ICD-10-CM

## 2023-09-04 DIAGNOSIS — F329 Major depressive disorder, single episode, unspecified: Secondary | ICD-10-CM | POA: Diagnosis present

## 2023-09-04 DIAGNOSIS — Z6841 Body Mass Index (BMI) 40.0 and over, adult: Secondary | ICD-10-CM

## 2023-09-04 DIAGNOSIS — Z5900 Homelessness unspecified: Secondary | ICD-10-CM

## 2023-09-04 DIAGNOSIS — E66813 Obesity, class 3: Secondary | ICD-10-CM | POA: Diagnosis present

## 2023-09-04 DIAGNOSIS — R000 Tachycardia, unspecified: Secondary | ICD-10-CM | POA: Diagnosis present

## 2023-09-04 DIAGNOSIS — I82401 Acute embolism and thrombosis of unspecified deep veins of right lower extremity: Secondary | ICD-10-CM | POA: Diagnosis present

## 2023-09-04 DIAGNOSIS — Z818 Family history of other mental and behavioral disorders: Secondary | ICD-10-CM

## 2023-09-04 DIAGNOSIS — E872 Acidosis, unspecified: Secondary | ICD-10-CM | POA: Diagnosis present

## 2023-09-04 DIAGNOSIS — Z79899 Other long term (current) drug therapy: Secondary | ICD-10-CM

## 2023-09-04 DIAGNOSIS — R569 Unspecified convulsions: Secondary | ICD-10-CM | POA: Diagnosis present

## 2023-09-04 DIAGNOSIS — I2602 Saddle embolus of pulmonary artery with acute cor pulmonale: Principal | ICD-10-CM | POA: Diagnosis present

## 2023-09-04 DIAGNOSIS — I82409 Acute embolism and thrombosis of unspecified deep veins of unspecified lower extremity: Secondary | ICD-10-CM | POA: Insufficient documentation

## 2023-09-04 DIAGNOSIS — R55 Syncope and collapse: Secondary | ICD-10-CM | POA: Diagnosis present

## 2023-09-04 DIAGNOSIS — F411 Generalized anxiety disorder: Secondary | ICD-10-CM | POA: Diagnosis present

## 2023-09-04 HISTORY — PX: IR THROMBECT PRIM MECH INIT (INCLU) MOD SED: IMG2297

## 2023-09-04 HISTORY — PX: IR US GUIDE VASC ACCESS RIGHT: IMG2390

## 2023-09-04 HISTORY — PX: IR ANGIOGRAM PULMONARY BILATERAL SELECTIVE: IMG664

## 2023-09-04 HISTORY — PX: IR ANGIOGRAM SELECTIVE EACH ADDITIONAL VESSEL: IMG667

## 2023-09-04 LAB — BASIC METABOLIC PANEL
Anion gap: 16 — ABNORMAL HIGH (ref 5–15)
BUN: 13 mg/dL (ref 6–20)
CO2: 18 mmol/L — ABNORMAL LOW (ref 22–32)
Calcium: 8.6 mg/dL — ABNORMAL LOW (ref 8.9–10.3)
Chloride: 104 mmol/L (ref 98–111)
Creatinine, Ser: 1.21 mg/dL — ABNORMAL HIGH (ref 0.44–1.00)
GFR, Estimated: >60 mL/min (ref >60)
Glucose, Bld: 244 mg/dL — ABNORMAL HIGH (ref 70–99)
Potassium: 3.1 mmol/L — ABNORMAL LOW (ref 3.5–5.1)
Sodium: 138 mmol/L (ref 135–145)

## 2023-09-04 LAB — CBC WITH DIFFERENTIAL/PLATELET
Abs Immature Granulocytes: 0.1 10*3/uL — ABNORMAL HIGH (ref 0.00–0.07)
Basophils Absolute: 0.1 10*3/uL (ref 0.0–0.1)
Basophils Relative: 1 %
Eosinophils Absolute: 0.3 10*3/uL (ref 0.0–0.5)
Eosinophils Relative: 2 %
HCT: 41.5 % (ref 36.0–46.0)
Hemoglobin: 13.3 g/dL (ref 12.0–15.0)
Immature Granulocytes: 1 %
Lymphocytes Relative: 28 %
Lymphs Abs: 4.5 10*3/uL — ABNORMAL HIGH (ref 0.7–4.0)
MCH: 28.9 pg (ref 26.0–34.0)
MCHC: 32 g/dL (ref 30.0–36.0)
MCV: 90 fL (ref 80.0–100.0)
Monocytes Absolute: 1.3 10*3/uL — ABNORMAL HIGH (ref 0.1–1.0)
Monocytes Relative: 8 %
Neutro Abs: 10.1 10*3/uL — ABNORMAL HIGH (ref 1.7–7.7)
Neutrophils Relative %: 60 %
Platelets: 316 10*3/uL (ref 150–400)
RBC: 4.61 MIL/uL (ref 3.87–5.11)
RDW: 12.8 % (ref 11.5–15.5)
WBC: 16.4 10*3/uL — ABNORMAL HIGH (ref 4.0–10.5)
nRBC: 0 % (ref 0.0–0.2)

## 2023-09-04 LAB — CBC
HCT: 36.6 % (ref 36.0–46.0)
Hemoglobin: 12.1 g/dL (ref 12.0–15.0)
MCH: 29.2 pg (ref 26.0–34.0)
MCHC: 33.1 g/dL (ref 30.0–36.0)
MCV: 88.2 fL (ref 80.0–100.0)
Platelets: 213 10*3/uL (ref 150–400)
RBC: 4.15 MIL/uL (ref 3.87–5.11)
RDW: 12.6 % (ref 11.5–15.5)
WBC: 10.6 10*3/uL — ABNORMAL HIGH (ref 4.0–10.5)
nRBC: 0 % (ref 0.0–0.2)

## 2023-09-04 LAB — I-STAT ARTERIAL BLOOD GAS, ED
Acid-base deficit: 13 mmol/L — ABNORMAL HIGH (ref 0.0–2.0)
Bicarbonate: 12.4 mmol/L — ABNORMAL LOW (ref 20.0–28.0)
Calcium, Ion: 1.13 mmol/L — ABNORMAL LOW (ref 1.15–1.40)
HCT: 39 % (ref 36.0–46.0)
Hemoglobin: 13.3 g/dL (ref 12.0–15.0)
O2 Saturation: 100 %
Potassium: 3.5 mmol/L (ref 3.5–5.1)
Sodium: 137 mmol/L (ref 135–145)
TCO2: 13 mmol/L — ABNORMAL LOW (ref 22–32)
pCO2 arterial: 26.7 mm[Hg] — ABNORMAL LOW (ref 32–48)
pH, Arterial: 7.274 — ABNORMAL LOW (ref 7.35–7.45)
pO2, Arterial: 345 mm[Hg] — ABNORMAL HIGH (ref 83–108)

## 2023-09-04 LAB — ECHOCARDIOGRAM COMPLETE
Height: 72 in
S' Lateral: 2.8 cm
Weight: 5224 [oz_av]

## 2023-09-04 LAB — HEPATIC FUNCTION PANEL
ALT: 18 U/L (ref 0–44)
AST: 19 U/L (ref 15–41)
Albumin: 3.8 g/dL (ref 3.5–5.0)
Alkaline Phosphatase: 62 U/L (ref 38–126)
Bilirubin, Direct: 0.2 mg/dL (ref 0.0–0.2)
Indirect Bilirubin: 0.4 mg/dL (ref 0.3–0.9)
Total Bilirubin: 0.6 mg/dL (ref <1.2)
Total Protein: 7.2 g/dL (ref 6.5–8.1)

## 2023-09-04 LAB — RESP PANEL BY RT-PCR (RSV, FLU A&B, COVID)  RVPGX2
Influenza A by PCR: NEGATIVE
Influenza B by PCR: NEGATIVE
Resp Syncytial Virus by PCR: NEGATIVE
SARS Coronavirus 2 by RT PCR: NEGATIVE

## 2023-09-04 LAB — HCG, SERUM, QUALITATIVE: Preg, Serum: NEGATIVE

## 2023-09-04 LAB — PROTIME-INR
INR: 1 (ref 0.8–1.2)
INR: 1.2 (ref 0.8–1.2)
Prothrombin Time: 13.5 s (ref 11.4–15.2)
Prothrombin Time: 15.1 s (ref 11.4–15.2)

## 2023-09-04 LAB — LACTIC ACID, PLASMA
Lactic Acid, Venous: 6.5 mmol/L (ref 0.5–1.9)
Lactic Acid, Venous: 2.4 mmol/L (ref 0.5–1.9)
Lactic Acid, Venous: 2.1 mmol/L (ref 0.5–1.9)

## 2023-09-04 LAB — POCT ACTIVATED CLOTTING TIME: Activated Clotting Time: 135 s

## 2023-09-04 LAB — GLUCOSE, CAPILLARY: Glucose-Capillary: 95 mg/dL (ref 70–99)

## 2023-09-04 LAB — HEPARIN LEVEL (UNFRACTIONATED): Heparin Unfractionated: >1.1 [IU]/mL — ABNORMAL HIGH (ref 0.30–0.70)

## 2023-09-04 LAB — APTT: aPTT: 75 s — ABNORMAL HIGH (ref 24–36)

## 2023-09-04 LAB — HIV ANTIBODY (ROUTINE TESTING W REFLEX): HIV Screen 4th Generation wRfx: NONREACTIVE

## 2023-09-04 LAB — MRSA NEXT GEN BY PCR, NASAL: MRSA by PCR Next Gen: NOT DETECTED

## 2023-09-04 LAB — TROPONIN I (HIGH SENSITIVITY)
Troponin I (High Sensitivity): 119 ng/L (ref <18)
Troponin I (High Sensitivity): 395 ng/L (ref <18)

## 2023-09-04 LAB — CBG MONITORING, ED: Glucose-Capillary: 155 mg/dL — ABNORMAL HIGH (ref 70–99)

## 2023-09-04 LAB — BRAIN NATRIURETIC PEPTIDE
B Natriuretic Peptide: 804.9 pg/mL — ABNORMAL HIGH (ref 0.0–100.0)
B Natriuretic Peptide: 765.7 pg/mL — ABNORMAL HIGH (ref 0.0–100.0)

## 2023-09-04 MED ORDER — SODIUM CHLORIDE 0.9 % IV BOLUS
1000.0000 mL | Freq: Once | INTRAVENOUS | Status: AC
Start: 2023-09-04 — End: 2023-09-04
  Administered 2023-09-04: 1000 mL via INTRAVENOUS

## 2023-09-04 MED ORDER — FENTANYL CITRATE (PF) 100 MCG/2ML IJ SOLN
INTRAMUSCULAR | Status: AC | PRN
  Administered 2023-09-04 (×2): 25 ug via INTRAVENOUS

## 2023-09-04 MED ORDER — IOHEXOL 350 MG/ML SOLN
100.0000 mL | Freq: Once | INTRAVENOUS | Status: AC | PRN
  Administered 2023-09-04: 100 mL via INTRAVENOUS

## 2023-09-04 MED ORDER — HEPARIN (PORCINE) 25000 UT/250ML-% IV SOLN
1850.0000 [IU]/h | INTRAVENOUS | Status: DC
  Administered 2023-09-04: 1700 [IU]/h via INTRAVENOUS
  Administered 2023-09-05: 1850 [IU]/h via INTRAVENOUS
  Filled 2023-09-04: qty 250

## 2023-09-04 MED ORDER — LIDOCAINE HCL 1 % IJ SOLN
INTRAMUSCULAR | Status: AC
  Filled 2023-09-04: qty 20

## 2023-09-04 MED ORDER — PERFLUTREN LIPID MICROSPHERE
1.0000 mL | INTRAVENOUS | Status: AC | PRN
  Administered 2023-09-04: 2 mL via INTRAVENOUS

## 2023-09-04 MED ORDER — HEPARIN (PORCINE) 25000 UT/250ML-% IV SOLN
1700.0000 [IU]/h | INTRAVENOUS | Status: DC
  Administered 2023-09-04 (×2): 1700 [IU]/h via INTRAVENOUS
  Filled 2023-09-04 (×2): qty 250

## 2023-09-04 MED ORDER — MIDAZOLAM HCL 2 MG/2ML IJ SOLN
INTRAMUSCULAR | Status: AC | PRN
  Administered 2023-09-04: .5 mg via INTRAVENOUS
  Administered 2023-09-04: 1 mg via INTRAVENOUS

## 2023-09-04 MED ORDER — MIDAZOLAM HCL 2 MG/2ML IJ SOLN
INTRAMUSCULAR | Status: AC
  Filled 2023-09-04: qty 2

## 2023-09-04 MED ORDER — ONDANSETRON HCL 4 MG/2ML IJ SOLN
4.0000 mg | Freq: Once | INTRAMUSCULAR | Status: AC
Start: 2023-09-04 — End: 2023-09-04
  Administered 2023-09-04: 4 mg via INTRAVENOUS
  Filled 2023-09-04: qty 2

## 2023-09-04 MED ORDER — IOHEXOL 300 MG/ML  SOLN
150.0000 mL | Freq: Once | INTRAMUSCULAR | Status: AC | PRN
  Administered 2023-09-04: 100 mL via INTRA_ARTERIAL

## 2023-09-04 MED ORDER — LIDOCAINE HCL 1 % IJ SOLN
20.0000 mL | Freq: Once | INTRAMUSCULAR | Status: AC
  Administered 2023-09-04: 10 mL via INTRADERMAL
  Filled 2023-09-04: qty 20

## 2023-09-04 MED ORDER — FENTANYL CITRATE (PF) 100 MCG/2ML IJ SOLN
INTRAMUSCULAR | Status: AC
  Filled 2023-09-04: qty 2

## 2023-09-04 MED ORDER — HEPARIN BOLUS VIA INFUSION
6500.0000 [IU] | Freq: Once | INTRAVENOUS | Status: AC
  Administered 2023-09-04: 6500 [IU] via INTRAVENOUS

## 2023-09-04 MED ORDER — SODIUM CHLORIDE 0.9 % IV BOLUS
1000.0000 mL | Freq: Once | INTRAVENOUS | Status: AC
  Administered 2023-09-04: 1000 mL via INTRAVENOUS

## 2023-09-04 MED ORDER — CHLORHEXIDINE GLUCONATE CLOTH 2 % EX PADS
6.0000 | MEDICATED_PAD | Freq: Every day | CUTANEOUS | Status: DC
  Administered 2023-09-05 – 2023-09-07 (×2): 6 via TOPICAL

## 2023-09-04 MED ORDER — IOHEXOL 300 MG/ML  SOLN
100.0000 mL | Freq: Once | INTRAMUSCULAR | Status: AC | PRN
  Administered 2023-09-04: 10 mL via INTRA_ARTERIAL

## 2023-09-04 MED ORDER — DOCUSATE SODIUM 100 MG PO CAPS: 100.0000 mg | ORAL_CAPSULE | Freq: Two times a day (BID) | ORAL | Status: DC | PRN

## 2023-09-04 MED ORDER — POLYETHYLENE GLYCOL 3350 17 G PO PACK
17.0000 g | PACK | Freq: Every day | ORAL | Status: DC | PRN
  Administered 2023-09-05: 17 g via ORAL
  Filled 2023-09-04: qty 1

## 2023-09-04 MED ORDER — HEPARIN BOLUS VIA INFUSION
INTRAVENOUS | Status: AC | PRN
  Administered 2023-09-04: 5000 [IU] via INTRAVENOUS
  Administered 2023-09-04: 3000 [IU] via INTRAVENOUS

## 2023-09-04 NOTE — ED Notes (Signed)
Greggory Stallion called for equipment going with patient to ED or ICU. He will cal back when truck is in route.-ABB

## 2023-09-04 NOTE — ED Provider Notes (Signed)
Beth [name] Provider Note   CSN: [id number] Arrival date & time: 09/04/23  0732     History  Chief Complaint  Patient presents with   Loss of Consciousness    Beth [name] is a 32 y.o. female.  Pt is a 32 yo female with pmhx significant for depression and homelessness.  Pt was brought in by a friend and when she arrived, she had a witnessed seizure by the nurses which lasted about a minute.  Pt had stopped seizing by the time she was brought to a room.  Pt said she's had n/v and sob.  She said she lost consciousness yesterday.  She is homeless and has been living in her car.  Last night, she stayed in her car despite it being in the 20s last night.  She said she kept the car on all night.   Pt now remembers that she passed out last night and hit her head.  She did call EMS and said they told her her sob was anxiety and did not take her to the ED.       Home Medications Prior to Admission medications   Medication Sig Start Date End Date Taking? Authorizing Provider  oxymetazoline (AFRIN) 0.05 % nasal spray Place 1 spray into both nostrils 2 (two) times daily.   Yes [provider]  acetaminophen (TYLENOL) 325 MG tablet Take 2 tablets (650 mg total) by mouth every 6 (six) hours as needed for mild pain (or temp > 100). 01/25/19   Juliet Rude, PA-C  escitalopram (LEXAPRO) 10 MG tablet Take 1 tablet (10 mg total) by mouth daily. 04/11/23 10/08/23  Oneta Rack, NP  ibuprofen (ADVIL) 200 MG tablet Take 3 tablets (600 mg total) by mouth every 6 (six) hours as needed for moderate pain. 01/25/19   Juliet Rude, PA-C  oxyCODONE (OXY IR/ROXICODONE) 5 MG immediate release tablet Take 1-2 tablets (5-10 mg total) by mouth every 6 (six) hours as needed for severe pain. Patient not taking: Reported on 09/04/2023 01/25/19   Juliet Rude, PA-C      Allergies    Patient has no known allergies.    Review of Systems   Review of Systems   Respiratory:  Positive for shortness of breath.   Gastrointestinal:  Positive for nausea and vomiting.  All other systems reviewed and are negative.   Physical Exam Updated Vital Signs BP 95/69   Pulse 98   Temp 97.8 F (36.6 C) (Oral)   Resp 15   Ht 6' (1.829 m)   Wt (!) 148.1 kg   SpO2 97%   BMI 44.28 kg/m  Physical Exam Vitals and nursing note reviewed.  Constitutional:      General: She is in acute distress.     Appearance: Normal appearance. She is ill-appearing.  HENT:     Head: Normocephalic and atraumatic.     Right Ear: External ear normal.     Left Ear: External ear normal.     Nose: Nose normal.     Mouth/Throat:     Mouth: Mucous membranes are dry.  Eyes:     Extraocular Movements: Extraocular movements intact.     Conjunctiva/sclera: Conjunctivae normal.     Pupils: Pupils are equal, round, and reactive to light.  Cardiovascular:     Rate and Rhythm: Regular rhythm. Tachycardia present.     Pulses: Normal pulses.     Heart sounds: Normal heart sounds.  Pulmonary:  Effort: Tachypnea and respiratory distress present.     Comments: Pt cyanotic Abdominal:     General: Abdomen is flat. Bowel sounds are normal.     Palpations: Abdomen is soft.  Musculoskeletal:        General: Normal range of motion.     Cervical back: Normal range of motion and neck supple.  Skin:    General: Skin is warm.     Capillary Refill: Capillary refill takes less than 2 seconds.  Neurological:     General: No focal deficit present.     Mental Status: She is alert and oriented to person, place, and time.  Psychiatric:        Mood and Affect: Mood normal.     ED Results / Procedures / Treatments   Labs (all labs ordered are listed, but only abnormal results are displayed) Labs Reviewed  BASIC METABOLIC PANEL - Abnormal; Notable for the following components:      Result Value   Potassium 3.1 (*)    CO2 18 (*)    Glucose, Bld 244 (*)    Creatinine, Ser 1.21 (*)     Calcium 8.6 (*)    Anion gap 16 (*)    All other components within normal limits  BRAIN NATRIURETIC PEPTIDE - Abnormal; Notable for the following components:   B Natriuretic Peptide 804.9 (*)    All other components within normal limits  LACTIC ACID, PLASMA - Abnormal; Notable for the following components:   Lactic Acid, Venous 6.5 (*)    All other components within normal limits  CBC WITH DIFFERENTIAL/PLATELET - Abnormal; Notable for the following components:   WBC 16.4 (*)    Neutro Abs 10.1 (*)    Lymphs Abs 4.5 (*)    Monocytes Absolute 1.3 (*)    Abs Immature Granulocytes 0.10 (*)    All other components within normal limits  I-STAT ARTERIAL BLOOD GAS, ED - Abnormal; Notable for the following components:   pH, Arterial 7.274 (*)    pCO2 arterial 26.7 (*)    pO2, Arterial 345 (*)    Bicarbonate 12.4 (*)    TCO2 13 (*)    Acid-base deficit 13.0 (*)    Calcium, Ion 1.13 (*)    All other components within normal limits  CBG MONITORING, ED - Abnormal; Notable for the following components:   Glucose-Capillary 155 (*)    All other components within normal limits  TROPONIN I (HIGH SENSITIVITY) - Abnormal; Notable for the following components:   Troponin I (High Sensitivity) 119 (*)    All other components within normal limits  RESP PANEL BY RT-PCR (RSV, FLU A&B, COVID)  RVPGX2  CULTURE, BLOOD (ROUTINE X 2)  CULTURE, BLOOD (ROUTINE X 2)  HCG, SERUM, QUALITATIVE  HEPATIC FUNCTION PANEL  PROTIME-INR  CBC WITH DIFFERENTIAL/PLATELET  LACTIC ACID, PLASMA  COOXEMETRY PANEL  URINALYSIS, ROUTINE W REFLEX MICROSCOPIC  RAPID URINE DRUG SCREEN, HOSP PERFORMED  HEPARIN LEVEL (UNFRACTIONATED)  CBC  BRAIN NATRIURETIC PEPTIDE  PROTIME-INR  APTT  LACTIC ACID, PLASMA  HIV ANTIBODY (ROUTINE TESTING W REFLEX)  TROPONIN I (HIGH SENSITIVITY)  TROPONIN I (HIGH SENSITIVITY)    EKG None  Radiology No results found.  Procedures Procedures    Medications Ordered in ED Medications   heparin ADULT infusion 100 units/mL (25000 units/234mL) (1,700 Units/hr Intravenous New Bag/Given 09/04/23 0934)  docusate sodium (COLACE) capsule 100 mg (has no administration in time range)  polyethylene glycol (MIRALAX / GLYCOLAX) packet 17 g (has no administration  in time range)  sodium chloride 0.9 % bolus 1,000 mL (0 mLs Intravenous Stopped 09/04/23 0913)  iohexol (OMNIPAQUE) 350 MG/ML injection 100 mL (100 mLs Intravenous Contrast Given 09/04/23 0815)  ondansetron (ZOFRAN) injection 4 mg (4 mg Intravenous Given 09/04/23 0903)  sodium chloride 0.9 % bolus 1,000 mL (1,000 mLs Intravenous New Bag/Given 09/04/23 0913)  heparin bolus via infusion 6,500 Units (6,500 Units Intravenous Bolus from Bag 09/04/23 0939)    ED Course/ Medical Decision Making/ A&P                                 Medical Decision Making Amount and/or Complexity of Data Reviewed Labs: ordered. Radiology: ordered.  Risk Prescription drug management. Decision regarding hospitalization.   This patient presents to the ED for concern of sob, this involves an extensive number of treatment options, and is a complaint that carries with it a high risk of complications and morbidity.  The differential diagnosis includes PE, pna, covid/flu/rsv, CO poisoning   Co morbidities that complicate the patient evaluation  Homelessness   Additional history obtained:  Additional history obtained from epic chart review External records from outside source obtained and reviewed including friend   Lab Tests:  I Ordered, and personally interpreted labs.  The pertinent results include:  cbc with wbc elevated at 16.4; abg with pH low at 7.274, pCO2 26.7, pO2 345 (on 15L NRB); bmp with k sl low at 3.1, CO2 18, Glucose elevated at 244, AG 16; BNP elevated at 804.9; lactic elevated at 6.5, trop elevated at 119   Imaging Studies ordered:  I ordered imaging studies including cxr, ct chest  I independently visualized and interpreted  imaging which showed  CT chest: *Large volume acute occlusive and nonocclusive pulmonary embolism with right heart strain, as described above. No lung infarction. *There is a small grouping of tree-in-bud configuration nodules in the posterior segment of right upper lobe, which is nonspecific. Findings can be seen with aspiration or atypical pneumonia. *Multiple other nonacute observations, as described above. CT head: Normal CT appearance of the Brain; intravascular contrast present from earlier chest CTA. 2. Partially visible retained secretions in the nasopharynx.  I agree with the radiologist interpretation   Cardiac Monitoring:  The patient was maintained on a cardiac monitor.  I personally viewed and interpreted the cardiac monitored which showed an underlying rhythm of: st   Medicines ordered and prescription drug management:  I ordered medication including ivfs/zofran  for sx  Reevaluation of the patient after these medicines showed that the patient improved I have reviewed the patients home medicines and have made adjustments as needed   Test Considered:  ct   Critical Interventions:  oxygen   Consultations Obtained:  I requested consultation with the intensivist (Dr. Denese Killings),  and discussed lab and imaging findings as well as pertinent plan - he recommends admission to ICU at [facility].  He will consult IR for intervention.   Problem List / ED Course:  SOB:  due to hypoxia and tachycardia, I was worried about PE.  She does have large volume PE with heart strain.  She was initially on 15L NRB, but has been weaned down to 3L. She remembered that she passed out and hit her head last night and had a seizure this am, so she went back to the scanner for a CT of her head.  CT head is clear, so I have ordered heparin.  Awaiting  CCM consult for additional recs. There is also concern for CO poisoning as she's been living in her car with the car on.  Unfortunately, the machine  here is not working. Sample was couriered over to [facility], but it took too long to get there, so they could not run it.  However, massive PE explains many of her sx.  Pt remains stable for transfer and continues on 3L oxygen.   Reevaluation:  After the interventions noted above, I reevaluated the patient and found that they have :improved   Social Determinants of Health:  Homeless; living in car   Dispostion:  After consideration of the diagnostic results and the patients response to treatment, I feel that the patent would benefit from admission.  CRITICAL CARE Performed by: Jacalyn Lefevre   Total critical care time: 45 minutes  Critical care time was exclusive of separately billable procedures and treating other patients.  Critical care was necessary to treat or prevent imminent or life-threatening deterioration.  Critical care was time spent personally by me on the following activities: development of treatment plan with patient and/or surrogate as well as nursing, discussions with consultants, evaluation of patient's response to treatment, examination of patient, obtaining history from patient or surrogate, ordering and performing treatments and interventions, ordering and review of laboratory studies, ordering and review of radiographic studies, pulse oximetry and re-evaluation of patient's condition.           Final Clinical Impression(s) / ED Diagnoses Final diagnoses:  Acute massive pulmonary embolism (HCC)  Cor pulmonale, acute [facility])  Homeless    Rx / DC Orders ED Discharge Orders     None         Jacalyn Lefevre, MD 09/04/23 1021

## 2023-09-04 NOTE — ED Notes (Signed)
Care Handoff given to Lequita Halt, RN at [facility] and [place] as well. All questions answered; no further action needed at this time from receiving staff.

## 2023-09-04 NOTE — Sedation Documentation (Signed)
Pre main pulmonary pressure 54/20 (34) Post pressure 36/15 (25)

## 2023-09-04 NOTE — ED Notes (Signed)
RN returned to RM from CT with pt

## 2023-09-04 NOTE — ED Notes (Signed)
RT Note: Patient is currently on 2lpm [place] and tolerating well with no distress at this time

## 2023-09-04 NOTE — ED Notes (Signed)
RT Note: Physician aware that we are waiting on a [facility] Courier to transport blood sample to [facility].

## 2023-09-04 NOTE — Progress Notes (Signed)
EEG complete - results pending 

## 2023-09-04 NOTE — ED Notes (Addendum)
RT Note:  ABG completed and handed to Dr. Particia Nearing. A Cooxemetry was also ordered. Manager called to ask how to retrieve a cooxemetry reading. Manager stated to have lab call for a STAT courier to take sample to [place] [facility] to run for data.The courier for STAT labs have been called to come take a Blood sample to [facility] to run in their lab. Time STAT courier called is 0810. RT will stick another ABG and send with the courier as soon as courier is here in this facility. The facility RAD -57 is not  working  properly to retrieve data from patient. Machine error stating sensor signal.

## 2023-09-04 NOTE — Progress Notes (Signed)
PHARMACY - ANTICOAGULATION CONSULT NOTE  Pharmacy Consult for heparin Indication: pulmonary embolus  No Known Allergies  Patient Measurements: Height: 6' (182.9 cm) Weight: (!) 148.1 kg (326 lb 8 oz) IBW/kg (Calculated) : 73.1 Heparin Dosing Weight: 108.4kg  Vital Signs: Temp: 97.8 F (36.6 C) (12/03 0759) Temp Source: Oral (12/03 0759) BP: 97/84 (12/03 0900) Pulse Rate: 104 (12/03 0900)  Labs: Recent Labs    09/04/23 0749 09/04/23 0757 09/04/23 0758  HGB  --  13.3 13.3  HCT  --  39.0 41.5  PLT  --   --  316  CREATININE 1.21*  --   --   TROPONINIHS 119*  --   --     Estimated Creatinine Clearance: 108.6 mL/min (A) (by C-G formula based on SCr of 1.21 mg/dL (H)).   Medical History: Past Medical History:  Diagnosis Date   Anxiety     Medications:  Infusions:   heparin      Assessment: 64 yof presented to the ED with SOB and possible seizure activity vs syncope after sleeping in her car last night. Found to have a large PE with right heart strain. Now starting IV heparin. Baseline CBC is WNL. She is not on anticoagulation PTA.   Goal of Therapy:  Heparin level 0.3-0.7 units/ml Monitor platelets by anticoagulation protocol: Yes   Plan:  Heparin bolus 6500 units IV x 1 Heparin gtt 1700 units/hr Check a 6 hr heparin level Daily heparin level and CBC *May need assistance with medications at discharge  Beth [name], Drake Leach 09/04/2023,9:28 AM

## 2023-09-04 NOTE — H&P (Addendum)
NAME:  Beth [name], MRN:  [id number], DOB:  04/02/1991, LOS: 0 ADMISSION DATE:  09/04/2023, CONSULTATION DATE:  12/03 REFERRING MD:  EDP, CHIEF COMPLAINT:  Submassive PE   History of Present Illness:  Pt is a 32 year old female with past medical hx of MDD and GAD who is unhomed presenting to the ED with after a seizure like episode. She was noted to be hypoxic and tachyardia and CTA was ordered which showed bilateral pulmonary embolism with CTA showing right heart strain.  Pertinent  Medical History  MDD GAD Significant Hospital Events: Including procedures, antibiotic start and stop dates in addition to other pertinent events   12/03: admitted to ICU Interim History / Subjective:  States breathing is breather. No other acute concerns endorsed. States she fell down and things are hazy around the fall. She had similar episode earlier on 11/30. She states she lost consciousness and wasn't aware of what happened. No hx of seizures.   Objective   Blood pressure 99/71, pulse 97, temperature 98.5 F (36.9 C), temperature source Oral, resp. rate (!) 22, height 6' (1.829 m), weight (!) 148.1 kg, SpO2 96%.    FiO2 (%):  [28 %-100 %] 28 %   Intake/Output Summary (Last 24 hours) at 09/04/2023 1335 Last data filed at 09/04/2023 1027 Gross per 24 hour  Intake 1999 ml  Output --  Net 1999 ml   Filed Weights   09/04/23 0901  Weight: (!) 148.1 kg   Examination: General: laying in bed comfortably HENT: 2 L [place] Lungs: CTAB, no adventitious sounds Cardiovascular: NSR, good pluses Abdomen: No TTP, normal bowel sounds Extremities: no asymmetry, good bulk and tone Neuro: alert and oriented x4 GU: no foley present  Labs   CBC: Recent Labs  Lab 09/04/23 0757 09/04/23 0758 09/04/23 1248  WBC  --  16.4* 10.6*  NEUTROABS  --  10.1*  --   HGB 13.3 13.3 12.1  HCT 39.0 41.5 36.6  MCV  --  90.0 88.2  PLT  --  316 213   Basic Metabolic Panel: Recent Labs  Lab 09/04/23 0749 09/04/23 0757   NA 138 137  K 3.1* 3.5  CL 104  --   CO2 18*  --   GLUCOSE 244*  --   BUN 13  --   CREATININE 1.21*  --   CALCIUM 8.6*  --    GFR: Estimated Creatinine Clearance: 108.6 mL/min (A) (by C-G formula based on SCr of 1.21 mg/dL (H)). Recent Labs  Lab 09/04/23 0746 09/04/23 0758 09/04/23 1025 09/04/23 1248  WBC  --  16.4*  --  10.6*  LATICACIDVEN 6.5*  --  2.4*  --    Liver Function Tests: Recent Labs  Lab 09/04/23 0749  AST 19  ALT 18  ALKPHOS 62  BILITOT 0.6  PROT 7.2  ALBUMIN 3.8   No results for input(s): "LIPASE", "AMYLASE" in the last 168 hours. No results for input(s): "AMMONIA" in the last 168 hours. ABG    Component Value Date/Time   PHART 7.274 (L) 09/04/2023 0757   PCO2ART 26.7 (L) 09/04/2023 0757   PO2ART 345 (H) 09/04/2023 0757   HCO3 12.4 (L) 09/04/2023 0757   TCO2 13 (L) 09/04/2023 0757   ACIDBASEDEF 13.0 (H) 09/04/2023 0757   O2SAT 100 09/04/2023 0757    Coagulation Profile: Recent Labs  Lab 09/04/23 0748  INR 1.0   Cardiac Enzymes: No results for input(s): "CKTOTAL", "CKMB", "CKMBINDEX", "TROPONINI" in the last 168 hours. HbA1C: No results  found for: "HGBA1C" CBG: Recent Labs  Lab 09/04/23 0740 09/04/23 1201  GLUCAP 155* 95   Consults  IR  Assessment & Plan:  Principal Problem:   Bilateral pulmonary embolism (HCC) Active Problems:   Pulmonary embolism (HCC)  Bilateral Pulmonary Embolism CTA shows large volume acute occlusive and non-occlusive PE with right heart strain. No lung infarction noted.  Was started on IV heparin. Echo pending. BNP elevated. IR consulted and given the clot burden plan is to perform angiogram and possible thrombectomy vs possible catheter directed lysis.   Acute Hypoxic Respiratory Failure: likely 2/2  problem 1. No acute concern for infection. No concern for TB given no hemoptysis, fevers, or chills, and no known exposures. The CTA findings can be explained by aspiration given by retained secretions show  in the  nasopharynx on CTH.   Loss Of Consciousness: Could be secondary to PE vs seizure. Given RV dysfunction points towards a PE as etiology but elevated lactic acid points towards seizure as an underlying etiology. Unifying etiology could be hypoperfusion secondary to PE leading to a seizure. Given no previous hx of seizure, unlikely a primary seizure disorder.   AGMA: secondary to LA which is likely secondary to seizure.   AKI: Mild. Creatinine elevated 1.21. Will trend for now.   MDD/GAD: Chronic. Will continue home lexapro  Poor Social Determinants of Health: Consult TOC for unhomed status to provide resources  Best Practice (right click and "Reselect all SmartList Selections" daily)  Diet/type: NPO DVT prophylaxis: systemic heparin GI prophylaxis: N/A Lines: N/A Foley:  N/A and Yes, and it is still needed Continuous: Heparin  Code Status:  full code Last date of multidisciplinary goals of care discussion [12/03]

## 2023-09-04 NOTE — Progress Notes (Signed)
Patient currently unavailable for routine EEG placement due to IR. Nurse will let us know when she is ready and we will get it done ASAP.

## 2023-09-04 NOTE — ED Notes (Signed)
RT Note: Patient was placed on 100% NRB after getting patient to the room due to increased WOB and SOB. Patient had what appeared to be a seizure appearance with some cyanosis around lips. Patient SPO2 100% on the NRB running at 15lpm

## 2023-09-04 NOTE — ED Notes (Signed)
Patient in CT

## 2023-09-04 NOTE — Procedures (Signed)
Interventional Radiology Procedure:   Indications: Bilateral pulmonary embolism with right heart strain  Procedure: Pulmonary angiography with pressures. Aspiration/mechanical thrombectomy of pulmonary emboli  Findings: Pre treatment PA pressure: 54/20, mean 34.  Post treatment PA pressure 35/15, mean 25.  Removed a large amount of clot from right pulmonary arterial tree.  Small amount of clot removed from left side.   Complications: None     EBL: Minimal  Plan: Return to ICU.  Keep right leg straight for 3 hours.  Will remove suture tomorrow.   Sadao Weyer R. Lowella Dandy, MD  Pager: [phone]

## 2023-09-04 NOTE — ED Notes (Signed)
Pt aware of the need for a urine... Unable to currently provide the sample... 

## 2023-09-04 NOTE — ED Triage Notes (Signed)
Brought in by friend for eval of SOB x1 week. Reports multiple syncopal eposides. Upon arrival to ED, patient has SZ vs. Syncopal episode in lobby in [facility]. She slumped forward and was caught by the staff. No fall. Taken straight back to room. Patient pale, regained consciousness, then  began nausea and vomiting. Room air 90%. Denies pain. LMP now.

## 2023-09-04 NOTE — Progress Notes (Signed)
PHARMACY - ANTICOAGULATION CONSULT NOTE  Pharmacy Consult for heparin Indication: pulmonary embolus  No Known Allergies  Patient Measurements: Height: 6' (182.9 cm) Weight: (!) 148.1 kg (326 lb 8 oz) IBW/kg (Calculated) : 73.1 Heparin Dosing Weight: 108.4kg  Vital Signs: Temp: 98.1 F (36.7 C) (12/03 1946) Temp Source: Oral (12/03 1946) BP: 86/66 (12/03 1900) Pulse Rate: 97 (12/03 1900)  Labs: Recent Labs    09/04/23 0748 09/04/23 0749 09/04/23 0757 09/04/23 0757 09/04/23 0758 09/04/23 1248 09/04/23 1804  HGB  --   --  13.3   < > 13.3 12.1  --   HCT  --   --  39.0  --  41.5 36.6  --   PLT  --   --   --   --  316 213  --   APTT  --   --   --   --   --  75*  --   LABPROT 13.5  --   --   --   --  15.1  --   INR 1.0  --   --   --   --  1.2  --   HEPARINUNFRC  --   --   --   --   --   --  >1.10*  CREATININE  --  1.21*  --   --   --   --   --   TROPONINIHS  --  119*  --   --   --  395*  --    < > = values in this interval not displayed.    Estimated Creatinine Clearance: 108.6 mL/min (A) (by C-G formula based on SCr of 1.21 mg/dL (H)).   Medical History: Past Medical History:  Diagnosis Date   Anxiety     Medications:  Infusions:   heparin      Assessment: 39 yof presented to the ED with SOB and possible seizure activity vs syncope after sleeping in her car last night. Found to have a large PE with right heart strain. Now starting IV heparin. Baseline CBC is WNL. She is not on anticoagulation PTA.   12/3 1800: Heparin level >1.1, supra-therapeutic on 1700 units/hr. Lab drawn appropriately and no bleeding noted per RN. Patient received heparin 5000 units x1 at 1415 and heparin 3000 units x1 at 1515 during IR procedure today, suspect this is the cause of the elevated heparin level.  Goal of Therapy:  Heparin level 0.3-0.7 units/ml Monitor platelets by anticoagulation protocol: Yes   Plan:  Hold heparin for 1 hour, then resume heparin gtt at 1700  units/hr Check a 6 hr heparin level Daily heparin level and CBC *May need assistance with medications at discharge  Enos Fling, PharmD PGY-1 Acute Care Pharmacy Resident 09/04/2023 7:53 PM

## 2023-09-04 NOTE — Discharge Planning (Signed)
Lolly Glaus J. Lucretia Roers, RN, BSN, [employer] [phone] Spoke with pt at bedside regarding benefits check for apixaban and rivaroxaban for PE.  Pt is uninsured and is eligible for free 30 day free card.   Information relayed to pt.  Pt verbalizes importance of filling medication upon discharge.

## 2023-09-04 NOTE — ED Notes (Addendum)
RT Note: Just received word from the [facility], Raynelle Fanning [phone], that the courier was late getting the ABG sample to them to run test. The cooxemetry test could not be run on this sample with accuracy. Dr. Particia Nearing informed and she stated to just wait until the patient gets to [facility] to re draw for this ABG test.

## 2023-09-04 NOTE — ED Notes (Addendum)
Only 1 blood culture obtained... Provider informed.Marland KitchenMarland Kitchen

## 2023-09-04 NOTE — ED Notes (Signed)
RT Note: ABG was handed off to courier to take to [facility] to run cooxemtery test for this patient

## 2023-09-04 NOTE — Consult Note (Signed)
Chief Complaint: Patient was seen in consultation today for symptomatic pulmonary embolism.  Referring Physician(s): Lynnell Catalan, MD  Supervising Physician: Richarda Overlie  Patient Status: [facility] - In-pt  History of Present Illness: Beth [name] is a 32 y.o. female with a past medical history of anxiety and depression who presented to [facility] ED at [facility] this morning with a complaint of loss of consciousness. Upon arrival to the ED the nurses reported witnessing a seizure which stopped by the time she was placed in a room. She reported several episodes of loss of consciousness the previous day with associated nausea, vomiting and dyspnea. She was found be tachycardic and hypoxic on initial evaluation. CTA chest was obtained and showed:  *Large volume acute occlusive and nonocclusive pulmonary embolism with right heart strain, as described above. No lung infarction. *There is a small grouping of tree-in-bud configuration nodules in the posterior segment of right upper lobe, which is nonspecific. Findings can be seen with aspiration or atypical pneumonia. *Multiple other nonacute observations, as described above  She was started on heparin gtt and transferred to [facility] ICU for possible intervention. IR has been consulted for pulmonary angiogram with possible thrombectomy/possible PE lysis.  Patient seen with Dr. Lowella Dandy in the ICU, undergoing echo and having labs drawn. She confirms above history and adds that she felt like she was just very congested at first but after sleeping in her car overnight due to being homeless at the moment her shortness of breath started to worsen, she began to vomit and she passed out several times before her friend brought her to the ED. She traveled to [facility] over the Thanksgiving holiday spending ~4 hours in the car each way. She does not use hormonal birth control and takes no medications regularly. 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 She has no history of DVT, PE or malignancy.  Discussed pulmonary angiogram with possible thrombectomy and/or possible lysis today in IR which she is agreeable to.  Past Medical History:  Diagnosis Date   Anxiety     Past Surgical History:  Procedure Laterality Date   CHOLECYSTECTOMY Left 01/24/2019   Procedure: LAPAROSCOPIC CHOLECYSTECTOMY WITH INTRAOPERATIVE CHOLANGIOGRAM;  Surgeon: Manus Rudd, MD;  Location: [facility] OR;  Service: General;  Laterality: Left;    Allergies: Patient has no known allergies.  Medications: Prior to Admission medications   Medication Sig Start Date End Date Taking? Authorizing Provider  oxymetazoline (AFRIN) 0.05 % nasal spray Place 1 spray into both nostrils 2 (two) times daily.   Yes [provider]  acetaminophen (TYLENOL) 325 MG tablet Take 2 tablets (650 mg total) by mouth every 6 (six) hours as needed for mild pain (or temp > 100). 01/25/19   Juliet Rude, PA-C  escitalopram (LEXAPRO) 10 MG tablet Take 1 tablet (10 mg total) by mouth daily. 04/11/23 10/08/23  Oneta Rack, NP  ibuprofen (ADVIL) 200 MG tablet Take 3 tablets (600 mg total) by mouth every 6 (six) hours as needed for moderate pain. 01/25/19   Juliet Rude, PA-C  oxyCODONE (OXY IR/ROXICODONE) 5 MG immediate release tablet Take 1-2 tablets (5-10 mg total) by mouth every 6 (six) hours as needed for severe pain. Patient not taking: Reported on 09/04/2023 01/25/19  Juliet Rude, PA-C     Family History  Problem Relation Age of Onset   Depression Maternal Grandmother    Depression Maternal Grandfather     Social History   Socioeconomic History   Marital status: Single    Spouse name: Not on file   Number of children: Not on file   Years of education: Not on file   Highest education level:  Not on file  Occupational History   Not on file  Tobacco Use   Smoking status: Never   Smokeless tobacco: Never   Tobacco comments:    [place]  Vaping Use   Vaping status: Former  Substance and Sexual Activity   Alcohol use: Yes    Comment: social   Drug use: Never   Sexual activity: Not on file  Other Topics Concern   Not on file  Social History Narrative   Not on file   Social Determinants of Health   Financial Resource Strain: Not on file  Food Insecurity: Not on file  Transportation Needs: Not on file  Physical Activity: Not on file  Stress: Not on file  Social Connections: Not on file     Review of Systems: A 12 point ROS discussed and pertinent positives are indicated in the HPI above.  All other systems are negative.  Review of Systems  Constitutional:  Positive for fatigue. Negative for chills and fever.  Respiratory:  Negative for cough and shortness of breath (None at rest with oxygen on, very short of breath prior to admission).   Cardiovascular:  Negative for chest pain and leg swelling.  Gastrointestinal:  Negative for abdominal pain, diarrhea, nausea and vomiting.  Musculoskeletal:  Negative for back pain.  Skin:  Negative for color change and pallor.  Neurological:  Positive for syncope (several times prior to admission). Negative for dizziness, light-headedness (None when laying down; very lightheaded when standing prior to admission) and headaches.  Psychiatric/Behavioral:  Negative for confusion.     Vital Signs: BP 99/71   Pulse 97   Temp 98.5 F (36.9 C) (Oral)   Resp (!) 22   Ht 6' (1.829 m)   Wt (!) 326 lb 8 oz (148.1 kg)   SpO2 96%   BMI 44.28 kg/m   Physical Exam Vitals and nursing note reviewed.  Constitutional:      General: She is not in acute distress.    Appearance: She is obese. She is not ill-appearing.  HENT:     Head: Normocephalic.     Mouth/Throat:     Mouth: Mucous membranes are moist.     Pharynx: Oropharynx is clear.  No oropharyngeal exudate or posterior oropharyngeal erythema.  Cardiovascular:     Rate and Rhythm: Normal rate and regular rhythm.  Pulmonary:     Effort: Pulmonary effort is normal.     Breath sounds: Normal breath sounds.  Abdominal:     General: There is no distension.     Palpations: Abdomen is soft.     Tenderness: There is no abdominal tenderness.  Musculoskeletal:     Right lower leg: No edema.     Left lower leg: No edema.  Skin:    General: Skin is warm and dry.  Neurological:     Mental Status: She is alert and oriented to person, place, and time.  Psychiatric:        Mood and Affect: Mood normal.        Behavior: Behavior normal.  Thought Content: Thought content normal.        Judgment: Judgment normal.      MD Evaluation Airway: WNL Heart: WNL Abdomen: WNL Chest/ Lungs: WNL ASA  Classification: 3 Mallampati/Airway Score: One   Imaging: CT Head Wo Contrast  Result Date: 09/04/2023 CLINICAL DATA:  32 year old female with cyanosis, seizure like activity. EXAM: CT HEAD WITHOUT CONTRAST TECHNIQUE: Contiguous axial images were obtained from the base of the skull through the vertex without intravenous contrast. RADIATION DOSE REDUCTION: This exam was performed according to the departmental dose-optimization program which includes automated exposure control, adjustment of the mA and/or kV according to patient size and/or use of iterative reconstruction technique. COMPARISON:  None Available. FINDINGS: Brain: Intravascular contrast is present, chest CTA shortly before this exam. No midline shift, ventriculomegaly, mass effect, evidence of mass lesion, intracranial hemorrhage or evidence of cortically based acute infarction. Gray-white matter differentiation is within normal limits throughout the brain. No abnormal enhancement identified. Vascular: Intravascular contrast is present. Skull: Intact, negative. Sinuses/Orbits: Visualized paranasal sinuses and mastoids are  well aerated. Other: Visualized orbits and scalp soft tissues are within normal limits. Some retained secretions visible at the nasopharynx. IMPRESSION: 1. Normal CT appearance of the Brain; intravascular contrast present from earlier chest CTA. 2. Partially visible retained secretions in the nasopharynx. Electronically Signed   By: Odessa Fleming M.D.   On: 09/04/2023 09:10   DG Chest Port 1 View  Result Date: 09/04/2023 CLINICAL DATA:  Shortness of breath. EXAM: PORTABLE CHEST 1 VIEW COMPARISON:  None Available. FINDINGS: Bilateral lung fields are clear. Bilateral costophrenic angles are clear. Normal cardio-mediastinal silhouette. No acute osseous abnormalities. The soft tissues are within normal limits. IMPRESSION: *No active disease. Electronically Signed   By: Jules Schick M.D.   On: 09/04/2023 09:01   CT Angio Chest PE W and/or Wo Contrast  Result Date: 09/04/2023 CLINICAL DATA:  Pulmonary embolism (PE) suspected, high prob. Seizure. Cyanosis around lips. EXAM: CT ANGIOGRAPHY CHEST WITH CONTRAST TECHNIQUE: Multidetector CT imaging of the chest was performed using the standard protocol during bolus administration of intravenous contrast. Multiplanar CT image reconstructions and MIPs were obtained to evaluate the vascular anatomy. RADIATION DOSE REDUCTION: This exam was performed according to the departmental dose-optimization program which includes automated exposure control, adjustment of the mA and/or kV according to patient size and/or use of iterative reconstruction technique. CONTRAST:  OMNIPAQUE IOHEXOL 350 MG/ML SOLN COMPARISON:  None Available. FINDINGS: Cardiovascular: There is large volume acute occlusive and nonocclusive pulmonary embolism involving the bilateral main, lobar, segmental and proximal subsegmental pulmonary arteries. There is flattening of the interventricular septum, compatible with right heart strain. No lung infarction seen. Normal cardiac size. No pericardial effusion. No  aortic aneurysm. Mediastinum/Nodes: Visualized thyroid gland appears grossly unremarkable. No solid / cystic mediastinal masses. The esophagus is nondistended precluding optimal assessment. No axillary, mediastinal or hilar lymphadenopathy by size criteria. Lungs/Pleura: The central tracheo-bronchial tree is patent. There is a small grouping of tree-in-bud configuration nodules in the posterior segment of right upper lobe, which is nonspecific. Otherwise, no mass or consolidation. No pleural effusion or pneumothorax. No suspicious lung nodules. Upper Abdomen: Visualized upper abdominal viscera within normal limits. Note is made of surgically absent gallbladder. Musculoskeletal: The visualized soft tissues of the chest wall are grossly unremarkable. No suspicious osseous lesions. Review of the MIP images confirms the above findings. IMPRESSION: *Large volume acute occlusive and nonocclusive pulmonary embolism with right heart strain, as described above. No lung infarction. *There is a  small grouping of tree-in-bud configuration nodules in the posterior segment of right upper lobe, which is nonspecific. Findings can be seen with aspiration or atypical pneumonia. *Multiple other nonacute observations, as described above. Critical Value/emergent results were called by telephone at the time of interpretation on 09/04/2023 at 8:39 am to provider [facility] , who verbally acknowledged these results. Electronically Signed   By: Jules Schick M.D.   On: 09/04/2023 08:55    Labs:  CBC: Recent Labs    09/04/23 0757 09/04/23 0758  WBC  --  16.4*  HGB 13.3 13.3  HCT 39.0 41.5  PLT  --  316    COAGS: Recent Labs    09/04/23 0748  INR 1.0    BMP: Recent Labs    09/04/23 0749 09/04/23 0757  NA 138 137  K 3.1* 3.5  CL 104  --   CO2 18*  --   GLUCOSE 244*  --   BUN 13  --   CALCIUM 8.6*  --   CREATININE 1.21*  --   GFRNONAA >60  --     LIVER FUNCTION TESTS: Recent Labs    09/04/23 0749   BILITOT 0.6  AST 19  ALT 18  ALKPHOS 62  PROT 7.2  ALBUMIN 3.8    TUMOR MARKERS: No results for input(s): "AFPTM", "CEA", "CA199", "CHROMGRNA" in the last 8760 hours.  Assessment and Plan:  32 y/o F with no significant past medical history who presented to the ED at [facility] this morning with concerns for syncope, possible seizure, dyspnea, n/v found to have large volume PE with heart strain. She has been admitted to the ICU at [facility] and started on heparin gtt which she is tolerating well so far. She denies dyspnea at rest and is able to carry on a full conversation with me at bedside today. She is currently on 2L oxygen which was titrated down recently from 3L.   Given the amount of clot burden with heart strain as well as young age discussed proceeding with pulmonary angiogram with possible thrombectomy and/or possible catheter directed lysis. Risks and benefits discussed with patient by Dr. Lowella Dandy and she is agreeable to proceed.  She has been NPO since yesterday, labs reviewed and within acceptable limits. Plan for procedure ASAP today in IR.  PESI score = 82 (class II, low risk)  Risks and benefits of pulmonary  arteriogram with intervention were discussed with the patient including, but not limited to bleeding, infection, vascular injury, contrast induced renal failure, stroke, reperfusion hemorrhage, or even death.  This interventional procedure involves the use of X-rays and because of the nature of the planned procedure, it is possible that we will have prolonged use of X-ray fluoroscopy. Potential radiation risks to you include (but are not limited to) the following: - A slightly elevated risk for cancer  several years later in life. This risk is typically less than 0.5% percent. This risk is low in comparison to the normal incidence of human cancer, which is 33% for women and 50% for men according to the American Cancer Society. - Radiation induced injury can include skin  redness, resembling a rash, tissue breakdown / ulcers and hair loss (which can be temporary or permanent).  The likelihood of either of these occurring depends on the difficulty of the procedure and whether you are sensitive to radiation due to previous procedures, disease, or genetic conditions.  IF your procedure requires a prolonged use of radiation, you will be notified and given written  instructions for further action.  It is your responsibility to monitor the irradiated area for the 2 weeks following the procedure and to notify your physician if you are concerned that you have suffered a radiation induced injury.    All of the patient's questions were answered, patient is agreeable to proceed.  Consent signed and in chart.  Thank you for this interesting consult.  I greatly enjoyed meeting Bettylou Winterrowd and look forward to participating in their care.  A copy of this report was sent to the requesting provider on this date.  Electronically Signed: Villa Herb, PA-C 09/04/2023, 12:53 PM   I spent a total of 55 Miinutes in face to face in clinical consultation, greater than 50% of which was counseling/coordinating care for pulmonary embolism.

## 2023-09-04 NOTE — ED Notes (Signed)
Informed the Floor RN of the possibility of TB.Marland KitchenMarland Kitchen

## 2023-09-05 ENCOUNTER — Other Ambulatory Visit: Payer: Self-pay

## 2023-09-05 ENCOUNTER — Inpatient Hospital Stay (HOSPITAL_COMMUNITY): Payer: Self-pay

## 2023-09-05 ENCOUNTER — Encounter (HOSPITAL_COMMUNITY): Payer: Self-pay

## 2023-09-05 DIAGNOSIS — Z86711 Personal history of pulmonary embolism: Secondary | ICD-10-CM

## 2023-09-05 DIAGNOSIS — I2699 Other pulmonary embolism without acute cor pulmonale: Secondary | ICD-10-CM

## 2023-09-05 DIAGNOSIS — R569 Unspecified convulsions: Secondary | ICD-10-CM

## 2023-09-05 DIAGNOSIS — I82409 Acute embolism and thrombosis of unspecified deep veins of unspecified lower extremity: Secondary | ICD-10-CM | POA: Insufficient documentation

## 2023-09-05 LAB — BASIC METABOLIC PANEL
Anion gap: 6 (ref 5–15)
BUN: 10 mg/dL (ref 6–20)
CO2: 22 mmol/L (ref 22–32)
Calcium: 8.3 mg/dL — ABNORMAL LOW (ref 8.9–10.3)
Chloride: 109 mmol/L (ref 98–111)
Creatinine, Ser: 0.75 mg/dL (ref 0.44–1.00)
GFR, Estimated: >60 mL/min (ref >60)
Glucose, Bld: 110 mg/dL — ABNORMAL HIGH (ref 70–99)
Potassium: 3.6 mmol/L (ref 3.5–5.1)
Sodium: 137 mmol/L (ref 135–145)

## 2023-09-05 LAB — HEMOGLOBIN A1C
Hgb A1c MFr Bld: 5 % (ref 4.8–5.6)
Mean Plasma Glucose: 96.8 mg/dL

## 2023-09-05 LAB — HEPARIN LEVEL (UNFRACTIONATED): Heparin Unfractionated: 0.29 [IU]/mL — ABNORMAL LOW (ref 0.30–0.70)

## 2023-09-05 LAB — CBC
HCT: 33.3 % — ABNORMAL LOW (ref 36.0–46.0)
Hemoglobin: 10.8 g/dL — ABNORMAL LOW (ref 12.0–15.0)
MCH: 28.3 pg (ref 26.0–34.0)
MCHC: 32.4 g/dL (ref 30.0–36.0)
MCV: 87.4 fL (ref 80.0–100.0)
Platelets: 207 10*3/uL (ref 150–400)
RBC: 3.81 MIL/uL — ABNORMAL LOW (ref 3.87–5.11)
RDW: 12.9 % (ref 11.5–15.5)
WBC: 7 10*3/uL (ref 4.0–10.5)
nRBC: 0 % (ref 0.0–0.2)

## 2023-09-05 MED ORDER — ESCITALOPRAM OXALATE 10 MG PO TABS
10.0000 mg | ORAL_TABLET | Freq: Every day | ORAL | Status: DC
Start: 2023-09-05 — End: 2023-09-07
  Administered 2023-09-05 – 2023-09-07 (×3): 10 mg via ORAL
  Filled 2023-09-05 (×3): qty 1

## 2023-09-05 MED ORDER — APIXABAN 5 MG PO TABS: 5.0000 mg | ORAL_TABLET | Freq: Two times a day (BID) | ORAL | Status: DC

## 2023-09-05 MED ORDER — APIXABAN 5 MG PO TABS
10.0000 mg | ORAL_TABLET | Freq: Two times a day (BID) | ORAL | Status: DC
  Administered 2023-09-05 – 2023-09-07 (×5): 10 mg via ORAL
  Filled 2023-09-05 (×5): qty 2

## 2023-09-05 NOTE — TOC Initial Note (Addendum)
Transition of Care [facility]) - Initial/Assessment Note    Patient Details  Name: Beth [name] MRN: [id number] Date of Birth: [birth year]  Transition of Care [facility]) CM/SW Contact:    Marliss Coots, LCSW Phone Number: 09/05/2023, 9:23 AM  Clinical Narrative:                  9:23 AM This CSW received confirmaiton from Princess Perna with financial counseling that due to self pay patient was referred to [facility] for a Medicaid Screening.  11:32 AM CSW introduced herself and role to patient at bedside with RNCM Cheri Fowler. Patient consented this CSW to speak in front of RNCM. CSW informed patient of financial counseling's referral for Medicaid Screening. CSW followed up with patient on resources. Patient denied receiving assistance from any resources at this time ([address]). CSW offered [employer] in efforts to support patient in obtaining housing, mental heath, and other resources in the county. Patient accepted this offer. Patient informed CSW that she currently resides alone in her car. CSW inquired about transportation upon discharge. Patient stated that her friend plans to provide transportation for discharge. CSW informed patient of transportation resources (taxi voucher, bus passes) and of process to obtain these resources. Patient expressed understanding of the following information. RNCM inquired about PCP. Patient denied establishment with PCP. RNCM offered to coordinate patient with PCP, which patient accepted.  11:49 AM CSW returned to patient's bedside and provided [facility] [employer] and [employer] [place] [employer] List print out.  Barriers to Discharge: Homeless with medical needs   Patient Goals and CMS Choice Patient states their goals for this hospitalization and ongoing recovery are:: to go home          Expected Discharge Plan and Services                                               Prior Living Arrangements/Services                       Activities of Daily Living      Permission Sought/Granted                  Emotional Assessment         Alcohol / Substance Use: Not Applicable Psych Involvement: No (comment)  Admission diagnosis:  Pulmonary embolism (HCC) [I26.99] Homeless [Z59.00] Bilateral pulmonary embolism (HCC) [I26.99] Cor pulmonale, acute (HCC) [I26.09] Acute massive pulmonary embolism (HCC) [I26.99] Patient Active Problem List   Diagnosis Date Noted   Bilateral pulmonary embolism (HCC) 09/04/2023   Pulmonary embolism (HCC) 09/04/2023   Adjustment disorder with mixed anxiety and depressed mood 04/10/2023   MDD (major depressive disorder), recurrent episode (HCC) 04/10/2023   Acute cholecystitis 01/23/2019   PCP:  Patient, No Pcp Per Pharmacy:   [id number] [place], [place] - [address] AT [facility] [address] [facility] [address] [place] [address] Phone: [phone] Fax: [phone]     Social Determinants of Health (SDOH) Social History: SDOH Screenings   Depression (PHQ2-9): High Risk (04/11/2023)  Tobacco Use: Low Risk  (04/10/2023)   SDOH Interventions:     Readmission Risk Interventions     No data to display

## 2023-09-05 NOTE — TOC Progression Note (Signed)
Transition of Care [facility]) - Progression Note    Patient Details  Name: Honoka Byard MRN: [id number] Date of Birth: March 19, 1991  Transition of Care [facility]) CM/SW Contact  Harriet Masson, RN Phone Number: 09/05/2023, 2:12 PM  Clinical Narrative:     Patient given the financial assistance form for [employer].  Patient agreeable for TOC to make PCP apt. Apt info added to AVS.  Expected Discharge Plan: Homeless Shelter Barriers to Discharge: Continued Medical Work up, Homeless with medical needs, Inadequate or no insurance  Expected Discharge Plan and Services In-house Referral: Clinical Social Work     Living arrangements for the past 2 months: No permanent address (resides in [place])                                       Social Determinants of Health (SDOH) Interventions SDOH Screenings   Depression (PHQ2-9): High Risk (04/11/2023)  Tobacco Use: Low Risk  (04/10/2023)    Readmission Risk Interventions     No data to display

## 2023-09-05 NOTE — Procedures (Signed)
Patient Name: [name]  MRN: [id number]  Epilepsy Attending: Charlsie Quest  Referring Physician/Provider: Gwenevere Abbot, MD  Date: 09/04/2023 Duration: 30.29 mins  Patient history: 33 year old woman who presented with multiple episodes of syncope and presyncope with some seizure like activity.   Level of alertness: Awake, asleep  AEDs during EEG study: None  Technical aspects: This EEG study was done with scalp electrodes positioned according to the 10-20 International system of electrode placement. Electrical activity was reviewed with band pass filter of 1-70Hz , sensitivity of 7 uV/mm, display speed of 40mm/sec with a 60Hz  notched filter applied as appropriate. EEG data were recorded continuously and digitally stored.  Video monitoring was available and reviewed as appropriate.  Description: The posterior dominant rhythm consists of 9 Hz activity of moderate voltage (25-35 uV) seen predominantly in posterior head regions, symmetric and reactive to eye opening and eye closing. Sleep was characterized by vertex waves, sleep spindles (12 to 14 Hz), maximal frontocentral region. Hyperventilation and photic stimulation were not performed.     IMPRESSION: This study is within normal limits. No seizures or epileptiform discharges were seen throughout the recording.  A normal interictal EEG does not exclude the diagnosis of epilepsy.  Beth [name]

## 2023-09-05 NOTE — Progress Notes (Signed)
PHARMACY - ANTICOAGULATION CONSULT NOTE  Pharmacy Consult for Heparin Indication: pulmonary embolus  No Known Allergies  Patient Measurements: Height: 6' (182.9 cm) Weight: (!) 148.1 kg (326 lb 8 oz) IBW/kg (Calculated) : 73.1 Heparin Dosing Weight: 108.4kg  Vital Signs: Temp: 98.7 F (37.1 C) (12/04 0359) Temp Source: Oral (12/04 0359) BP: 105/76 (12/04 0300) Pulse Rate: 84 (12/04 0300)  Labs: Recent Labs    09/04/23 0748 09/04/23 0749 09/04/23 0757 09/04/23 0758 09/04/23 1248 09/04/23 1804 09/05/23 0317  HGB  --   --    < > 13.3 12.1  --  10.8*  HCT  --   --    < > 41.5 36.6  --  33.3*  PLT  --   --   --  316 213  --  207  APTT  --   --   --   --  75*  --   --   LABPROT 13.5  --   --   --  15.1  --   --   INR 1.0  --   --   --  1.2  --   --   HEPARINUNFRC  --   --   --   --   --  >1.10* 0.29*  CREATININE  --  1.21*  --   --   --   --  0.75  TROPONINIHS  --  119*  --   --  395*  --   --    < > = values in this interval not displayed.    Estimated Creatinine Clearance: 164.3 mL/min (by C-G formula based on SCr of 0.75 mg/dL).   Medical History: Past Medical History:  Diagnosis Date   Anxiety     Medications:  Infusions:   heparin 1,700 Units/hr (09/05/23 0300)    Assessment: 32 yof presented to the ED with SOB and possible seizure activity vs syncope after sleeping in her car last night. Found to have a large PE with right heart strain. Now starting IV heparin. Baseline CBC is WNL. She is not on anticoagulation PTA.   12/4 AM update:  Heparin level sub-therapeutic   Goal of Therapy:  Heparin level 0.3-0.7 units/ml Monitor platelets by anticoagulation protocol: Yes   Plan:  Inc heparin to 1850 units/hr 1200 heparin level  Abran Duke, PharmD, BCPS Clinical Pharmacist Phone: [phone]

## 2023-09-05 NOTE — Plan of Care (Signed)

## 2023-09-05 NOTE — Progress Notes (Signed)
Bilateral lower extremity venous duplex has been completed. Preliminary results can be found in CV Proc through chart review.  Results were given to the patient's nurse, Lequita Halt.  09/05/23 12:20 PM Olen Cordial RVT

## 2023-09-05 NOTE — Progress Notes (Signed)
Chief Complaint: Patient was seen today for pst PE thrombectomy  Supervising Physician: Marliss Coots  Patient Status: [facility] - In-pt  Subjective: S/p Bilat PE thrombectomy yesterday by Dr. Lowella Dandy Pt seen, sitting up in bed, feels much better, breathing improved. C/o some soreness at right groin site. Eliquis has been started LE venous duplex planned for today  Objective: Physical Exam: BP (!) 119/94   Pulse (!) 107   Temp 98.5 F (36.9 C) (Oral)   Resp 19   Ht 6' (1.829 m)   Wt (!) 314 lb 13.1 oz (142.8 kg)   SpO2 (!) 88%   BMI 42.70 kg/m  A&O x 3 Breathing unlabored, BS equal (R)groin soft, no hematoma. Pursestring buttress suture removed without difficulty.   Current Facility-Administered Medications:    apixaban (ELIQUIS) tablet 10 mg, 10 mg, Oral, BID, 10 mg at 09/05/23 1038 **FOLLOWED BY** [START ON 09/12/2023] apixaban (ELIQUIS) tablet 5 mg, 5 mg, Oral, BID, Agarwala, Ravi, MD   Chlorhexidine Gluconate Cloth 2 % PADS 6 each, 6 each, Topical, Daily, Agarwala, Ravi, MD, 6 each at 09/05/23 1020   docusate sodium (COLACE) capsule 100 mg, 100 mg, Oral, BID PRN, Lynnell Catalan, MD   escitalopram (LEXAPRO) tablet 10 mg, 10 mg, Oral, Daily, Gwenevere Abbot, MD, 10 mg at 09/05/23 1038   polyethylene glycol (MIRALAX / GLYCOLAX) packet 17 g, 17 g, Oral, Daily PRN, Lynnell Catalan, MD  Labs: CBC Recent Labs    09/04/23 1248 09/05/23 0317  WBC 10.6* 7.0  HGB 12.1 10.8*  HCT 36.6 33.3*  PLT 213 207   BMET Recent Labs    09/04/23 0749 09/04/23 0757 09/05/23 0317  NA 138 137 137  K 3.1* 3.5 3.6  CL 104  --  109  CO2 18*  --  22  GLUCOSE 244*  --  110*  BUN 13  --  10  CREATININE 1.21*  --  0.75  CALCIUM 8.6*  --  8.3*   LFT Recent Labs    09/04/23 0749  PROT 7.2  ALBUMIN 3.8  AST 19  ALT 18  ALKPHOS 62  BILITOT 0.6  BILIDIR 0.2  IBILI 0.4   PT/INR Recent Labs    09/04/23 0748 09/04/23 1248  LABPROT 13.5 15.1  INR 1.0 1.2      Studies/Results: EEG adult  Result Date: 09/05/2023 Charlsie Quest, MD     09/05/2023  9:27 AM Patient Name: Beth [name] MRN: [id number] Epilepsy Attending: Charlsie Quest Referring Physician/Provider: Gwenevere Abbot, MD Date: 09/04/2023 Duration: 30.29 mins Patient history: 31 year old woman who presented with multiple episodes of syncope and presyncope with some seizure like activity. Level of alertness: Awake, asleep AEDs during EEG study: None Technical aspects: This EEG study was done with scalp electrodes positioned according to the 10-20 International system of electrode placement. Electrical activity was reviewed with band pass filter of 1-70Hz , sensitivity of 7 uV/mm, display speed of 96mm/sec with a 60Hz  notched filter applied as appropriate. EEG data were recorded continuously and digitally stored.  Video monitoring was available and reviewed as appropriate. Description: The posterior dominant rhythm consists of 9 Hz activity of moderate voltage (25-35 uV) seen predominantly in posterior head regions, symmetric and reactive to eye opening and eye closing. Sleep was characterized by vertex waves, sleep spindles (12 to 14 Hz), maximal frontocentral region. Hyperventilation and photic stimulation were not performed.   IMPRESSION: This study is within normal limits. No seizures or epileptiform discharges were seen throughout the recording. A normal  interictal EEG does not exclude the diagnosis of epilepsy. Charlsie Quest   IR Angiogram Pulmonary Bilateral Selective  Result Date: 09/04/2023 INDICATION: 32 year old with submassive pulmonary emboli. CTA demonstrates a large volume of clot with right heart strain. EXAM: 1. Bilateral pulmonary angiography 2. Mechanical thrombectomy of bilateral pulmonary emboli 3. Ultrasound guidance for vascular access 4. Pulmonary artery pressures COMPARISON:  None Available. MEDICATIONS: Heparin 8000 units ANESTHESIA/SEDATION: Moderate (conscious) sedation  was employed during this procedure. A total of Versed 1.5 mg and Fentanyl 50 mcg was administered intravenously by the radiology nurse. Total intra-service moderate Sedation Time: 95 minutes. The patient's level of consciousness and vital signs were monitored continuously by radiology nursing throughout the procedure under my direct supervision. FLUOROSCOPY: Radiation Exposure Index (as provided by the fluoroscopic device): 250 mGy Kerma CONTRAST:  110 mL Omnipaque 300 COMPLICATIONS: None immediate. TECHNIQUE: Informed written consent was obtained from the patient after a thorough discussion of the procedural risks, benefits and alternatives. All questions were addressed. A timeout was performed prior to the initiation of the procedure. Patient was placed supine on the interventional table. Ultrasound demonstrated a patent right common femoral vein. Ultrasound image was saved for documentation. Right groin was prepped and draped in sterile fashion. Skin was anesthetized using 1% lidocaine. Small incision was made. Using ultrasound guidance, 21 gauge needle was directed into the right common femoral vein and micropuncture dilator set was placed. Six [place] vascular sheath was placed. Venogram was performed through the sheath to confirm patency of the IVC. 6 French curved pigtail catheter was successfully advanced through the right heart into the main pulmonary artery. Pulmonary arterial pressure was obtained. Pigtail catheter was exchanged for a vert catheter. This 5 French catheter was advanced into a right lower lobe pulmonary arterial branch using a Bentson wire. The catheter was removed over a superstiff Amplatz wire. Inari sheath was advanced over the wire and the 24 French Flow Triever catheter was successfully advanced into the distal right pulmonary artery. Multiple aspirations were performed and a large amount of clot was removed from the right pulmonary arteries. Angiography was performed in the right  pulmonary artery between aspirations. Majority of the central clot was removed from the right pulmonary arteries and attention was directed to the left pulmonary artery. The 24 French catheter was successfully advanced into the left pulmonary artery over the wire. Multiple aspirations were performed and a small amount of clot was removed. Follow-up angiography was performed. Final pulmonary artery pressure was performed through the 24 French catheter in the main pulmonary artery. The 24 French catheter was completely removed with the wire. The right groin sheath was removed using pursestring suture. Right groin hemostasis at the end of the procedure. Bandage placed over the puncture site. FINDINGS: IVC was patent without significant thrombus. Main pulmonary artery pressure prior to treatment was 54/20 mmHg, mean 34 Main pulmonary pressure after treatment was 36/15 mmHg, mean 25 Large amount of thrombus was removed from the right pulmonary arterial tree. Markedly improved flow in the central right pulmonary arteries following the mechanical thrombectomy. Small amount of clot was removed from the left pulmonary arterial tree. Residual clot in the lobar and proximal segmental left pulmonary artery branches. IMPRESSION: Mechanical thrombectomy of bilateral pulmonary emboli. Electronically Signed   By: Richarda Overlie M.D.   On: 09/04/2023 18:11   IR US Guide Vasc Access Right  Result Date: 09/04/2023 INDICATION: 32 year old with submassive pulmonary emboli. CTA demonstrates a large volume of clot with right heart strain. EXAM:  1. Bilateral pulmonary angiography 2. Mechanical thrombectomy of bilateral pulmonary emboli 3. Ultrasound guidance for vascular access 4. Pulmonary artery pressures COMPARISON:  None Available. MEDICATIONS: Heparin 8000 units ANESTHESIA/SEDATION: Moderate (conscious) sedation was employed during this procedure. A total of Versed 1.5 mg and Fentanyl 50 mcg was administered intravenously by the  radiology nurse. Total intra-service moderate Sedation Time: 95 minutes. The patient's level of consciousness and vital signs were monitored continuously by radiology nursing throughout the procedure under my direct supervision. FLUOROSCOPY: Radiation Exposure Index (as provided by the fluoroscopic device): 250 mGy Kerma CONTRAST:  110 mL Omnipaque 300 COMPLICATIONS: None immediate. TECHNIQUE: Informed written consent was obtained from the patient after a thorough discussion of the procedural risks, benefits and alternatives. All questions were addressed. A timeout was performed prior to the initiation of the procedure. Patient was placed supine on the interventional table. Ultrasound demonstrated a patent right common femoral vein. Ultrasound image was saved for documentation. Right groin was prepped and draped in sterile fashion. Skin was anesthetized using 1% lidocaine. Small incision was made. Using ultrasound guidance, 21 gauge needle was directed into the right common femoral vein and micropuncture dilator set was placed. Six [place] vascular sheath was placed. Venogram was performed through the sheath to confirm patency of the IVC. 6 French curved pigtail catheter was successfully advanced through the right heart into the main pulmonary artery. Pulmonary arterial pressure was obtained. Pigtail catheter was exchanged for a vert catheter. This 5 French catheter was advanced into a right lower lobe pulmonary arterial branch using a Bentson wire. The catheter was removed over a superstiff Amplatz wire. Inari sheath was advanced over the wire and the 24 French Flow Triever catheter was successfully advanced into the distal right pulmonary artery. Multiple aspirations were performed and a large amount of clot was removed from the right pulmonary arteries. Angiography was performed in the right pulmonary artery between aspirations. Majority of the central clot was removed from the right pulmonary arteries and  attention was directed to the left pulmonary artery. The 24 French catheter was successfully advanced into the left pulmonary artery over the wire. Multiple aspirations were performed and a small amount of clot was removed. Follow-up angiography was performed. Final pulmonary artery pressure was performed through the 24 French catheter in the main pulmonary artery. The 24 French catheter was completely removed with the wire. The right groin sheath was removed using pursestring suture. Right groin hemostasis at the end of the procedure. Bandage placed over the puncture site. FINDINGS: IVC was patent without significant thrombus. Main pulmonary artery pressure prior to treatment was 54/20 mmHg, mean 34 Main pulmonary pressure after treatment was 36/15 mmHg, mean 25 Large amount of thrombus was removed from the right pulmonary arterial tree. Markedly improved flow in the central right pulmonary arteries following the mechanical thrombectomy. Small amount of clot was removed from the left pulmonary arterial tree. Residual clot in the lobar and proximal segmental left pulmonary artery branches. IMPRESSION: Mechanical thrombectomy of bilateral pulmonary emboli. Electronically Signed   By: Richarda Overlie M.D.   On: 09/04/2023 18:11   IR Angiogram Selective Each Additional Vessel  Result Date: 09/04/2023 INDICATION: 32 year old with submassive pulmonary emboli. CTA demonstrates a large volume of clot with right heart strain. EXAM: 1. Bilateral pulmonary angiography 2. Mechanical thrombectomy of bilateral pulmonary emboli 3. Ultrasound guidance for vascular access 4. Pulmonary artery pressures COMPARISON:  None Available. MEDICATIONS: Heparin 8000 units ANESTHESIA/SEDATION: Moderate (conscious) sedation was employed during this procedure. A total of  Versed 1.5 mg and Fentanyl 50 mcg was administered intravenously by the radiology nurse. Total intra-service moderate Sedation Time: 95 minutes. The patient's level of  consciousness and vital signs were monitored continuously by radiology nursing throughout the procedure under my direct supervision. FLUOROSCOPY: Radiation Exposure Index (as provided by the fluoroscopic device): 250 mGy Kerma CONTRAST:  110 mL Omnipaque 300 COMPLICATIONS: None immediate. TECHNIQUE: Informed written consent was obtained from the patient after a thorough discussion of the procedural risks, benefits and alternatives. All questions were addressed. A timeout was performed prior to the initiation of the procedure. Patient was placed supine on the interventional table. Ultrasound demonstrated a patent right common femoral vein. Ultrasound image was saved for documentation. Right groin was prepped and draped in sterile fashion. Skin was anesthetized using 1% lidocaine. Small incision was made. Using ultrasound guidance, 21 gauge needle was directed into the right common femoral vein and micropuncture dilator set was placed. Six [place] vascular sheath was placed. Venogram was performed through the sheath to confirm patency of the IVC. 6 French curved pigtail catheter was successfully advanced through the right heart into the main pulmonary artery. Pulmonary arterial pressure was obtained. Pigtail catheter was exchanged for a vert catheter. This 5 French catheter was advanced into a right lower lobe pulmonary arterial branch using a Bentson wire. The catheter was removed over a superstiff Amplatz wire. Inari sheath was advanced over the wire and the 24 French Flow Triever catheter was successfully advanced into the distal right pulmonary artery. Multiple aspirations were performed and a large amount of clot was removed from the right pulmonary arteries. Angiography was performed in the right pulmonary artery between aspirations. Majority of the central clot was removed from the right pulmonary arteries and attention was directed to the left pulmonary artery. The 24 French catheter was successfully advanced  into the left pulmonary artery over the wire. Multiple aspirations were performed and a small amount of clot was removed. Follow-up angiography was performed. Final pulmonary artery pressure was performed through the 24 French catheter in the main pulmonary artery. The 24 French catheter was completely removed with the wire. The right groin sheath was removed using pursestring suture. Right groin hemostasis at the end of the procedure. Bandage placed over the puncture site. FINDINGS: IVC was patent without significant thrombus. Main pulmonary artery pressure prior to treatment was 54/20 mmHg, mean 34 Main pulmonary pressure after treatment was 36/15 mmHg, mean 25 Large amount of thrombus was removed from the right pulmonary arterial tree. Markedly improved flow in the central right pulmonary arteries following the mechanical thrombectomy. Small amount of clot was removed from the left pulmonary arterial tree. Residual clot in the lobar and proximal segmental left pulmonary artery branches. IMPRESSION: Mechanical thrombectomy of bilateral pulmonary emboli. Electronically Signed   By: Richarda Overlie M.D.   On: 09/04/2023 18:11   IR Angiogram Selective Each Additional Vessel  Result Date: 09/04/2023 INDICATION: 32 year old with submassive pulmonary emboli. CTA demonstrates a large volume of clot with right heart strain. EXAM: 1. Bilateral pulmonary angiography 2. Mechanical thrombectomy of bilateral pulmonary emboli 3. Ultrasound guidance for vascular access 4. Pulmonary artery pressures COMPARISON:  None Available. MEDICATIONS: Heparin 8000 units ANESTHESIA/SEDATION: Moderate (conscious) sedation was employed during this procedure. A total of Versed 1.5 mg and Fentanyl 50 mcg was administered intravenously by the radiology nurse. Total intra-service moderate Sedation Time: 95 minutes. The patient's level of consciousness and vital signs were monitored continuously by radiology nursing throughout the procedure under my  direct supervision. FLUOROSCOPY: Radiation Exposure Index (as provided by the fluoroscopic device): 250 mGy Kerma CONTRAST:  110 mL Omnipaque 300 COMPLICATIONS: None immediate. TECHNIQUE: Informed written consent was obtained from the patient after a thorough discussion of the procedural risks, benefits and alternatives. All questions were addressed. A timeout was performed prior to the initiation of the procedure. Patient was placed supine on the interventional table. Ultrasound demonstrated a patent right common femoral vein. Ultrasound image was saved for documentation. Right groin was prepped and draped in sterile fashion. Skin was anesthetized using 1% lidocaine. Small incision was made. Using ultrasound guidance, 21 gauge needle was directed into the right common femoral vein and micropuncture dilator set was placed. Six [place] vascular sheath was placed. Venogram was performed through the sheath to confirm patency of the IVC. 6 French curved pigtail catheter was successfully advanced through the right heart into the main pulmonary artery. Pulmonary arterial pressure was obtained. Pigtail catheter was exchanged for a vert catheter. This 5 French catheter was advanced into a right lower lobe pulmonary arterial branch using a Bentson wire. The catheter was removed over a superstiff Amplatz wire. Inari sheath was advanced over the wire and the 24 French Flow Triever catheter was successfully advanced into the distal right pulmonary artery. Multiple aspirations were performed and a large amount of clot was removed from the right pulmonary arteries. Angiography was performed in the right pulmonary artery between aspirations. Majority of the central clot was removed from the right pulmonary arteries and attention was directed to the left pulmonary artery. The 24 French catheter was successfully advanced into the left pulmonary artery over the wire. Multiple aspirations were performed and a small amount of clot was  removed. Follow-up angiography was performed. Final pulmonary artery pressure was performed through the 24 French catheter in the main pulmonary artery. The 24 French catheter was completely removed with the wire. The right groin sheath was removed using pursestring suture. Right groin hemostasis at the end of the procedure. Bandage placed over the puncture site. FINDINGS: IVC was patent without significant thrombus. Main pulmonary artery pressure prior to treatment was 54/20 mmHg, mean 34 Main pulmonary pressure after treatment was 36/15 mmHg, mean 25 Large amount of thrombus was removed from the right pulmonary arterial tree. Markedly improved flow in the central right pulmonary arteries following the mechanical thrombectomy. Small amount of clot was removed from the left pulmonary arterial tree. Residual clot in the lobar and proximal segmental left pulmonary artery branches. IMPRESSION: Mechanical thrombectomy of bilateral pulmonary emboli. Electronically Signed   By: Richarda Overlie M.D.   On: 09/04/2023 18:11   IR THROMBECT PRIM MECH INIT (INCLU) MOD SED  Result Date: 09/04/2023 INDICATION: 33 year old with submassive pulmonary emboli. CTA demonstrates a large volume of clot with right heart strain. EXAM: 1. Bilateral pulmonary angiography 2. Mechanical thrombectomy of bilateral pulmonary emboli 3. Ultrasound guidance for vascular access 4. Pulmonary artery pressures COMPARISON:  None Available. MEDICATIONS: Heparin 8000 units ANESTHESIA/SEDATION: Moderate (conscious) sedation was employed during this procedure. A total of Versed 1.5 mg and Fentanyl 50 mcg was administered intravenously by the radiology nurse. Total intra-service moderate Sedation Time: 95 minutes. The patient's level of consciousness and vital signs were monitored continuously by radiology nursing throughout the procedure under my direct supervision. FLUOROSCOPY: Radiation Exposure Index (as provided by the fluoroscopic device): 250 mGy Kerma  CONTRAST:  110 mL Omnipaque 300 COMPLICATIONS: None immediate. TECHNIQUE: Informed written consent was obtained from the patient after a thorough discussion of the  procedural risks, benefits and alternatives. All questions were addressed. A timeout was performed prior to the initiation of the procedure. Patient was placed supine on the interventional table. Ultrasound demonstrated a patent right common femoral vein. Ultrasound image was saved for documentation. Right groin was prepped and draped in sterile fashion. Skin was anesthetized using 1% lidocaine. Small incision was made. Using ultrasound guidance, 21 gauge needle was directed into the right common femoral vein and micropuncture dilator set was placed. Six [place] vascular sheath was placed. Venogram was performed through the sheath to confirm patency of the IVC. 6 French curved pigtail catheter was successfully advanced through the right heart into the main pulmonary artery. Pulmonary arterial pressure was obtained. Pigtail catheter was exchanged for a vert catheter. This 5 French catheter was advanced into a right lower lobe pulmonary arterial branch using a Bentson wire. The catheter was removed over a superstiff Amplatz wire. Inari sheath was advanced over the wire and the 24 French Flow Triever catheter was successfully advanced into the distal right pulmonary artery. Multiple aspirations were performed and a large amount of clot was removed from the right pulmonary arteries. Angiography was performed in the right pulmonary artery between aspirations. Majority of the central clot was removed from the right pulmonary arteries and attention was directed to the left pulmonary artery. The 24 French catheter was successfully advanced into the left pulmonary artery over the wire. Multiple aspirations were performed and a small amount of clot was removed. Follow-up angiography was performed. Final pulmonary artery pressure was performed through the 24 French  catheter in the main pulmonary artery. The 24 French catheter was completely removed with the wire. The right groin sheath was removed using pursestring suture. Right groin hemostasis at the end of the procedure. Bandage placed over the puncture site. FINDINGS: IVC was patent without significant thrombus. Main pulmonary artery pressure prior to treatment was 54/20 mmHg, mean 34 Main pulmonary pressure after treatment was 36/15 mmHg, mean 25 Large amount of thrombus was removed from the right pulmonary arterial tree. Markedly improved flow in the central right pulmonary arteries following the mechanical thrombectomy. Small amount of clot was removed from the left pulmonary arterial tree. Residual clot in the lobar and proximal segmental left pulmonary artery branches. IMPRESSION: Mechanical thrombectomy of bilateral pulmonary emboli. Electronically Signed   By: Richarda Overlie M.D.   On: 09/04/2023 18:11   IR THROMBECT PRIM MECH INIT (INCLU) MOD SED  Result Date: 09/04/2023 INDICATION: 32 year old with submassive pulmonary emboli. CTA demonstrates a large volume of clot with right heart strain. EXAM: 1. Bilateral pulmonary angiography 2. Mechanical thrombectomy of bilateral pulmonary emboli 3. Ultrasound guidance for vascular access 4. Pulmonary artery pressures COMPARISON:  None Available. MEDICATIONS: Heparin 8000 units ANESTHESIA/SEDATION: Moderate (conscious) sedation was employed during this procedure. A total of Versed 1.5 mg and Fentanyl 50 mcg was administered intravenously by the radiology nurse. Total intra-service moderate Sedation Time: 95 minutes. The patient's level of consciousness and vital signs were monitored continuously by radiology nursing throughout the procedure under my direct supervision. FLUOROSCOPY: Radiation Exposure Index (as provided by the fluoroscopic device): 250 mGy Kerma CONTRAST:  110 mL Omnipaque 300 COMPLICATIONS: None immediate. TECHNIQUE: Informed written consent was obtained  from the patient after a thorough discussion of the procedural risks, benefits and alternatives. All questions were addressed. A timeout was performed prior to the initiation of the procedure. Patient was placed supine on the interventional table. Ultrasound demonstrated a patent right common femoral vein. Ultrasound image was  saved for documentation. Right groin was prepped and draped in sterile fashion. Skin was anesthetized using 1% lidocaine. Small incision was made. Using ultrasound guidance, 21 gauge needle was directed into the right common femoral vein and micropuncture dilator set was placed. Six [place] vascular sheath was placed. Venogram was performed through the sheath to confirm patency of the IVC. 6 French curved pigtail catheter was successfully advanced through the right heart into the main pulmonary artery. Pulmonary arterial pressure was obtained. Pigtail catheter was exchanged for a vert catheter. This 5 French catheter was advanced into a right lower lobe pulmonary arterial branch using a Bentson wire. The catheter was removed over a superstiff Amplatz wire. Inari sheath was advanced over the wire and the 24 French Flow Triever catheter was successfully advanced into the distal right pulmonary artery. Multiple aspirations were performed and a large amount of clot was removed from the right pulmonary arteries. Angiography was performed in the right pulmonary artery between aspirations. Majority of the central clot was removed from the right pulmonary arteries and attention was directed to the left pulmonary artery. The 24 French catheter was successfully advanced into the left pulmonary artery over the wire. Multiple aspirations were performed and a small amount of clot was removed. Follow-up angiography was performed. Final pulmonary artery pressure was performed through the 24 French catheter in the main pulmonary artery. The 24 French catheter was completely removed with the wire. The right  groin sheath was removed using pursestring suture. Right groin hemostasis at the end of the procedure. Bandage placed over the puncture site. FINDINGS: IVC was patent without significant thrombus. Main pulmonary artery pressure prior to treatment was 54/20 mmHg, mean 34 Main pulmonary pressure after treatment was 36/15 mmHg, mean 25 Large amount of thrombus was removed from the right pulmonary arterial tree. Markedly improved flow in the central right pulmonary arteries following the mechanical thrombectomy. Small amount of clot was removed from the left pulmonary arterial tree. Residual clot in the lobar and proximal segmental left pulmonary artery branches. IMPRESSION: Mechanical thrombectomy of bilateral pulmonary emboli. Electronically Signed   By: Richarda Overlie M.D.   On: 09/04/2023 18:11   ECHOCARDIOGRAM COMPLETE  Result Date: 09/04/2023    ECHOCARDIOGRAM REPORT   Patient Name:   Beth [name] Date of Exam: 09/04/2023 Medical Rec #:  [id number]     Height:       72.0 in Accession #:    [id number]    Weight:       326.5 lb Date of Birth:  1991/06/24     BSA:          2.624 m Patient Age:    32 years      BP:           87/69 mmHg Patient Gender: F             HR:           99 bpm. Exam Location:  Inpatient Procedure: 2D Echo, Color Doppler, Cardiac Doppler and Intracardiac            Opacification Agent Indications:    I26.02 Pulmonary embolus  History:        Patient has no prior history of Echocardiogram examinations.  Sonographer:    Irving Burton Senior RDCS Referring Phys: [id number] RAVI AGARWALA IMPRESSIONS  1. Though RV Basal Function is preserved, RV McConnell's Sign is noted. Mid free wall function notably reduced. Right ventricular systolic function is severely reduced. The right ventricular size is severely  enlarged. There is severely elevated pulmonary artery systolic pressure. The estimated right ventricular systolic pressure is 65.7 mmHg.  2. Left ventricular ejection fraction, by estimation, is 55 to 60%.  The left ventricle has normal function. The left ventricle has no regional wall motion abnormalities. Left ventricular diastolic parameters are indeterminate. There is the interventricular septum is flattened in systole and diastole, consistent with right ventricular pressure and volume overload.  3. Right atrial size was severely dilated.  4. A small pericardial effusion is present. The pericardial effusion is anterior to the right ventricle.  5. The mitral valve is normal in structure. Trivial mitral valve regurgitation. No evidence of mitral stenosis.  6. Tricuspid valve regurgitation is moderate.  7. The aortic valve was not well visualized. Aortic valve regurgitation is not visualized. No aortic stenosis is present. Comparison(s): No prior Echocardiogram. Conclusion(s)/Recommendation(s): Reaching out to primary team. FINDINGS  Left Ventricle: Left ventricular ejection fraction, by estimation, is 55 to 60%. The left ventricle has normal function. The left ventricle has no regional wall motion abnormalities. Definity contrast agent was given IV to delineate the left ventricular  endocardial borders. The left ventricular internal cavity size was normal in size. There is no left ventricular hypertrophy. The interventricular septum is flattened in systole and diastole, consistent with right ventricular pressure and volume overload. Left ventricular diastolic parameters are indeterminate. Right Ventricle: Though RV Basal Function is preserved, RV McConnell's Sign is noted. Mid free wall function notably reduced. The right ventricular size is severely enlarged. No increase in right ventricular wall thickness. Right ventricular systolic function is severely reduced. There is severely elevated pulmonary artery systolic pressure. The tricuspid regurgitant velocity is 3.56 m/s, and with an assumed right atrial pressure of 15 mmHg, the estimated right ventricular systolic pressure is 65.7 mmHg. Left Atrium: Left atrial  size was normal in size. Right Atrium: Right atrial size was severely dilated. Pericardium: A small pericardial effusion is present. The pericardial effusion is anterior to the right ventricle. Mitral Valve: The mitral valve is normal in structure. Trivial mitral valve regurgitation. No evidence of mitral valve stenosis. Tricuspid Valve: The tricuspid valve is normal in structure. Tricuspid valve regurgitation is moderate. Aortic Valve: The aortic valve was not well visualized. Aortic valve regurgitation is not visualized. No aortic stenosis is present. Pulmonic Valve: The pulmonic valve was normal in structure. Pulmonic valve regurgitation is not visualized. No evidence of pulmonic stenosis. Aorta: The aortic root and ascending aorta are structurally normal, with no evidence of dilitation. IAS/Shunts: No atrial level shunt detected by color flow Doppler.  LEFT VENTRICLE PLAX 2D LVIDd:         4.00 cm LVIDs:         2.80 cm LV PW:         0.90 cm LV IVS:        0.80 cm LVOT diam:     2.00 cm LV SV:         33 LV SV Index:   13 LVOT Area:     3.14 cm  RIGHT VENTRICLE RV S prime:     6.64 cm/s TAPSE (M-mode): 1.6 cm LEFT ATRIUM             Index        RIGHT ATRIUM           Index LA diam:        3.00 cm 1.14 cm/m   RA Area:     26.30 cm LA Vol (A2C):  43.6 ml 16.62 ml/m  RA Volume:   96.70 ml  36.85 ml/m LA Vol (A4C):   55.3 ml 21.08 ml/m LA Biplane Vol: 52.6 ml 20.05 ml/m  AORTIC VALVE LVOT Vmax:   65.25 cm/s LVOT Vmean:  48.400 cm/s LVOT VTI:    0.107 m  AORTA Ao Root diam: 2.80 cm Ao Asc diam:  2.50 cm TRICUSPID VALVE TR Peak grad:   50.7 mmHg TR Vmax:        356.00 cm/s  SHUNTS Systemic VTI:  0.11 m Systemic Diam: 2.00 cm Riley Lam MD Electronically signed by Riley Lam MD Signature Date/Time: 09/04/2023/1:14:03 PM    Final    CT Head Wo Contrast  Result Date: 09/04/2023 CLINICAL DATA:  32 year old female with cyanosis, seizure like activity. EXAM: CT HEAD WITHOUT CONTRAST  TECHNIQUE: Contiguous axial images were obtained from the base of the skull through the vertex without intravenous contrast. RADIATION DOSE REDUCTION: This exam was performed according to the departmental dose-optimization program which includes automated exposure control, adjustment of the mA and/or kV according to patient size and/or use of iterative reconstruction technique. COMPARISON:  None Available. FINDINGS: Brain: Intravascular contrast is present, chest CTA shortly before this exam. No midline shift, ventriculomegaly, mass effect, evidence of mass lesion, intracranial hemorrhage or evidence of cortically based acute infarction. Gray-white matter differentiation is within normal limits throughout the brain. No abnormal enhancement identified. Vascular: Intravascular contrast is present. Skull: Intact, negative. Sinuses/Orbits: Visualized paranasal sinuses and mastoids are well aerated. Other: Visualized orbits and scalp soft tissues are within normal limits. Some retained secretions visible at the nasopharynx. IMPRESSION: 1. Normal CT appearance of the Brain; intravascular contrast present from earlier chest CTA. 2. Partially visible retained secretions in the nasopharynx. Electronically Signed   By: Odessa Fleming M.D.   On: 09/04/2023 09:10   DG Chest Port 1 View  Result Date: 09/04/2023 CLINICAL DATA:  Shortness of breath. EXAM: PORTABLE CHEST 1 VIEW COMPARISON:  None Available. FINDINGS: Bilateral lung fields are clear. Bilateral costophrenic angles are clear. Normal cardio-mediastinal silhouette. No acute osseous abnormalities. The soft tissues are within normal limits. IMPRESSION: *No active disease. Electronically Signed   By: Jules Schick M.D.   On: 09/04/2023 09:01   CT Angio Chest PE W and/or Wo Contrast  Result Date: 09/04/2023 CLINICAL DATA:  Pulmonary embolism (PE) suspected, high prob. Seizure. Cyanosis around lips. EXAM: CT ANGIOGRAPHY CHEST WITH CONTRAST TECHNIQUE: Multidetector CT  imaging of the chest was performed using the standard protocol during bolus administration of intravenous contrast. Multiplanar CT image reconstructions and MIPs were obtained to evaluate the vascular anatomy. RADIATION DOSE REDUCTION: This exam was performed according to the departmental dose-optimization program which includes automated exposure control, adjustment of the mA and/or kV according to patient size and/or use of iterative reconstruction technique. CONTRAST:  OMNIPAQUE IOHEXOL 350 MG/ML SOLN COMPARISON:  None Available. FINDINGS: Cardiovascular: There is large volume acute occlusive and nonocclusive pulmonary embolism involving the bilateral main, lobar, segmental and proximal subsegmental pulmonary arteries. There is flattening of the interventricular septum, compatible with right heart strain. No lung infarction seen. Normal cardiac size. No pericardial effusion. No aortic aneurysm. Mediastinum/Nodes: Visualized thyroid gland appears grossly unremarkable. No solid / cystic mediastinal masses. The esophagus is nondistended precluding optimal assessment. No axillary, mediastinal or hilar lymphadenopathy by size criteria. Lungs/Pleura: The central tracheo-bronchial tree is patent. There is a small grouping of tree-in-bud configuration nodules in the posterior segment of right upper lobe, which is nonspecific. Otherwise, no mass  or consolidation. No pleural effusion or pneumothorax. No suspicious lung nodules. Upper Abdomen: Visualized upper abdominal viscera within normal limits. Note is made of surgically absent gallbladder. Musculoskeletal: The visualized soft tissues of the chest wall are grossly unremarkable. No suspicious osseous lesions. Review of the MIP images confirms the above findings. IMPRESSION: *Large volume acute occlusive and nonocclusive pulmonary embolism with right heart strain, as described above. No lung infarction. *There is a small grouping of tree-in-bud configuration  nodules in the posterior segment of right upper lobe, which is nonspecific. Findings can be seen with aspiration or atypical pneumonia. *Multiple other nonacute observations, as described above. Critical Value/emergent results were called by telephone at the time of interpretation on 09/04/2023 at 8:39 am to provider [facility] , who verbally acknowledged these results. Electronically Signed   By: Jules Schick M.D.   On: 09/04/2023 08:55    Assessment/Plan: S/p PE thrombectomy. Clinically improved. Right groin suture removed. Plan per primary, please call IR as needed.    LOS: 1 day   I spent a total of 20 minutes in face to face in clinical consultation, greater than 50% of which was counseling/coordinating care for PE thrombectomy  Brayton El PA-C 09/05/2023 10:58 AM

## 2023-09-05 NOTE — Progress Notes (Signed)
NAME:  Beth [name], MRN:  [id number], DOB:  Dec 21, 1990, LOS: 1 ADMISSION DATE:  09/04/2023, CONSULTATION DATE:  12/03 REFERRING MD:  EDP, CHIEF COMPLAINT:  Submassive PE   History of Present Illness:  Pt is a 32 year old female with past medical hx of MDD and GAD who is unhomed presenting to the ED with after a seizure like episode. She was noted to be hypoxic and tachyardia and CTA was ordered which showed bilateral pulmonary embolism with CTA showing right heart strain.  Pertinent  Medical History  MDD GAD Significant Hospital Events: Including procedures, antibiotic start and stop dates in addition to other pertinent events   12/03: admitted to ICU Interim History / Subjective:  States breathing is breather. No other acute concerns endorsed. States she fell down and things are hazy around the fall. She had similar episode earlier on 11/30. She states she lost consciousness and wasn't aware of what happened. No hx of seizures.   Objective   Blood pressure 111/84, pulse 82, temperature 98.5 F (36.9 C), temperature source Oral, resp. rate 18, height 6' (1.829 m), weight (!) 142.8 kg, SpO2 95%.        Intake/Output Summary (Last 24 hours) at 09/05/2023 0839 Last data filed at 09/05/2023 0600 Gross per 24 hour  Intake 2447.17 ml  Output 100 ml  Net 2347.17 ml   Filed Weights   09/04/23 0901 09/05/23 0400  Weight: (!) 148.1 kg (!) 142.8 kg   Examination: General: laying in bed comfortably HENT: NCAT Lungs: CTAB, no adventitious sounds Cardiovascular: NSR, good pluses Abdomen: No TTP, normal bowel sounds Extremities: no asymmetry, good bulk and tone. Surgical site looks clean.  Neuro: alert and oriented x4 GU: no foley present  Labs   CBC: Recent Labs  Lab 09/04/23 0757 09/04/23 0758 09/04/23 1248 09/05/23 0317  WBC  --  16.4* 10.6* 7.0  NEUTROABS  --  10.1*  --   --   HGB 13.3 13.3 12.1 10.8*  HCT 39.0 41.5 36.6 33.3*  MCV  --  90.0 88.2 87.4  PLT  --  316 213  207   Basic Metabolic Panel: Recent Labs  Lab 09/04/23 0749 09/04/23 0757 09/05/23 0317  NA 138 137 137  K 3.1* 3.5 3.6  CL 104  --  109  CO2 18*  --  22  GLUCOSE 244*  --  110*  BUN 13  --  10  CREATININE 1.21*  --  0.75  CALCIUM 8.6*  --  8.3*   GFR: Estimated Creatinine Clearance: 161 mL/min (by C-G formula based on SCr of 0.75 mg/dL). Recent Labs  Lab 09/04/23 0746 09/04/23 0758 09/04/23 1025 09/04/23 1248 09/05/23 0317  WBC  --  16.4*  --  10.6* 7.0  LATICACIDVEN 6.5*  --  2.4* 2.1*  --    Liver Function Tests: Recent Labs  Lab 09/04/23 0749  AST 19  ALT 18  ALKPHOS 62  BILITOT 0.6  PROT 7.2  ALBUMIN 3.8   No results for input(s): "LIPASE", "AMYLASE" in the last 168 hours. No results for input(s): "AMMONIA" in the last 168 hours. ABG    Component Value Date/Time   PHART 7.274 (L) 09/04/2023 0757   PCO2ART 26.7 (L) 09/04/2023 0757   PO2ART 345 (H) 09/04/2023 0757   HCO3 12.4 (L) 09/04/2023 0757   TCO2 13 (L) 09/04/2023 0757   ACIDBASEDEF 13.0 (H) 09/04/2023 0757   O2SAT 100 09/04/2023 0757    Coagulation Profile: Recent Labs  Lab 09/04/23  1610 09/04/23 1248  INR 1.0 1.2   Cardiac Enzymes: No results for input(s): "CKTOTAL", "CKMB", "CKMBINDEX", "TROPONINI" in the last 168 hours. HbA1C: Hgb A1c MFr Bld  Date/Time Value Ref Range Status  09/05/2023 03:17 AM 5.0 4.8 - 5.6 % Final    Comment:    (NOTE) Pre diabetes:          5.7%-6.4%  Diabetes:              >6.4%  Glycemic control for   <7.0% adults with diabetes    CBG: Recent Labs  Lab 09/04/23 0740 09/04/23 1201  GLUCAP 155* 95   Consults  IR  Assessment & Plan:  Principal Problem:   Bilateral pulmonary embolism (HCC) Active Problems:   Pulmonary embolism (HCC)  Bilateral Pulmonary Embolism Pt status post thrombectomy on 12/03. Appreciate IR consultation. On IV heparin, can transition to DOAC today. Improved on room air. Has no ICU needs and can transfer to med tele level  of care.   Acute Hypoxic Respiratory Failure: Improving. likely 2/2  problem 1. On room air this am.  Loss Of Consciousness: Could be secondary to PE vs seizure. Given RV dysfunction points towards a PE as etiology but elevated lactic acid points towards seizure as an underlying etiology. Unifying etiology could be hypoperfusion secondary to PE leading to a seizure. Given no previous hx of seizure, unlikely a primary seizure disorder. EEG negative for seizures.   MDD/GAD: Chronic. Will continue home lexapro  Poor Social Determinants of Health: Consult TOC for unhomed status to provide resources  Resolved Problems AGMA AKI  Best Practice (right click and "Reselect all SmartList Selections" daily)  Diet/type: Regular DVT prophylaxis: DOAC GI prophylaxis: N/A Lines: N/A Foley:  N/A and Yes, and it is still needed Continuous: Heparin  Code Status:  full code Last date of multidisciplinary goals of care discussion [12/04]

## 2023-09-05 NOTE — Discharge Instructions (Addendum)
Apixaban TREATMENT 1/15  Information on my medicine - ELIQUIS (apixaban)  This medication education was reviewed with me or my healthcare representative as part of my  discharge preparation.  ____________________________  WHY WAS ELIQUIS PRESCRIBED FOR YOU?  Eliquis was prescribed to treat blood clots that may have been found in the veins of your legs  (deep vein thrombosis) or in your lungs (pulmonary embolism) and to reduce the risk of them  occurring again. WHAT DO YOU NEED TO KNOW ABOUT ELIQUIS ?  The starting dose is 10 mg (two 5 mg tablets) taken TWICE daily for the FIRST SEVEN (7)  DAYS, then on (enter date) 09/12/23 at 10:00 the dose is reduced to ONE 5 mg tablet taken  TWICE daily. Eliquis may be taken with or without food.  Try to take the dose about the same time in the morning and in the evening. If you have difficulty  swallowing the tablet whole please discuss with your pharmacist how to take the medication  safely. Take Eliquis exactly as prescribed and DO NOT stop taking Eliquis without talking to the doctor  who prescribed the medication. Stopping may increase your risk of developing a new blood  clot. Refill your prescription before you run out.  After discharge, you should have regular check-up appointments with your healthcare  provider that is prescribing your Eliquis.  WHAT DO YOU DO IF YOU MISS A DOSE? If a dose of ELIQUIS is not taken at the scheduled time, take it as soon as possible on the same  day and twice-daily administration should be resumed. The dose should not be doubled to make  up for a missed dose.   IMPORTANT SAFETY INFORMATION A possible side effect of Eliquis is bleeding. You should call your healthcare provider right away  if you experience any of the following:  ? Bleeding from an injury or your nose that does not stop. ? Unusual colored urine (red or dark brown) or unusual colored stools (red or black). ? Unusual bruising for unknown  reasons. ? A serious fall or if you hit your head (even if there is no bleeding). Some medicines may interact with Eliquis and might increase your risk of bleeding or clotting  while on Eliquis. To help avoid this, consult your healthcare provider or pharmacist prior to using  any new prescription or non-prescription medications, including herbals, vitamins, non-steroidal  anti-inflammatory drugs (NSAIDs) and supplements.  This website has more information on Eliquis (apixaban): [email or web address].cy.

## 2023-09-06 ENCOUNTER — Encounter (HOSPITAL_COMMUNITY): Payer: Self-pay | Admitting: Pulmonary Disease

## 2023-09-06 LAB — CBC
HCT: 31.9 % — ABNORMAL LOW (ref 36.0–46.0)
Hemoglobin: 10.3 g/dL — ABNORMAL LOW (ref 12.0–15.0)
MCH: 28.2 pg (ref 26.0–34.0)
MCHC: 32.3 g/dL (ref 30.0–36.0)
MCV: 87.4 fL (ref 80.0–100.0)
Platelets: 196 10*3/uL (ref 150–400)
RBC: 3.65 MIL/uL — ABNORMAL LOW (ref 3.87–5.11)
RDW: 12.6 % (ref 11.5–15.5)
WBC: 6.1 10*3/uL (ref 4.0–10.5)
nRBC: 0 % (ref 0.0–0.2)

## 2023-09-06 MED ORDER — FLUTICASONE PROPIONATE 50 MCG/ACT NA SUSP
1.0000 | Freq: Every day | NASAL | Status: DC
  Administered 2023-09-06 – 2023-09-07 (×2): 1 via NASAL
  Filled 2023-09-06: qty 16

## 2023-09-06 MED ORDER — GUAIFENESIN ER 600 MG PO TB12
600.0000 mg | ORAL_TABLET | Freq: Two times a day (BID) | ORAL | Status: DC | PRN
  Administered 2023-09-06 – 2023-09-07 (×3): 600 mg via ORAL
  Filled 2023-09-06 (×3): qty 1

## 2023-09-06 MED ORDER — SALINE SPRAY 0.65 % NA SOLN
1.0000 | NASAL | Status: DC | PRN
  Filled 2023-09-06: qty 44

## 2023-09-06 MED ORDER — SALINE SPRAY 0.65 % NA SOLN: 1.0000 | NASAL | Status: AC | PRN

## 2023-09-06 MED ORDER — APIXABAN 5 MG PO TABS
ORAL_TABLET | ORAL | 3 refills | Status: AC
  Filled 2023-09-06: qty 60, 25d supply, fill #0
  Filled 2023-09-19 – 2023-10-01 (×2): qty 60, 30d supply, fill #0
  Filled 2023-10-28: qty 60, 30d supply, fill #1
  Filled 2023-11-28 – 2023-11-29 (×2): qty 60, 30d supply, fill #2
  Filled 2023-11-29: qty 30, 15d supply, fill #2

## 2023-09-06 NOTE — Progress Notes (Signed)
NAME:  Beth [name], MRN:  [id number], DOB:  12/12/1990, LOS: 2 ADMISSION DATE:  09/04/2023, CONSULTATION DATE:  12/03 REFERRING MD:  EDP, CHIEF COMPLAINT:  Submassive PE   History of Present Illness:  Pt is a 32 year old female with past medical hx of MDD and GAD who is unhomed presenting to the ED with after a seizure like episode. She was noted to be hypoxic and tachyardia and CTA was ordered which showed bilateral pulmonary embolism with CTA showing right heart strain.  Pertinent  Medical History  MDD GAD Significant Hospital Events: Including procedures, antibiotic start and stop dates in addition to other pertinent events   12/03: admitted to ICU Interim History / Subjective:  States doing well, has some coughing but chest pain improved.   Objective   Blood pressure 118/87, pulse 82, temperature 98.5 F (36.9 C), temperature source Oral, resp. rate 18, height 6' (1.829 m), weight (!) 142.8 kg, SpO2 95%.       No intake or output data in the 24 hours ending 09/06/23 1307  Filed Weights   09/04/23 0901 09/05/23 0400 09/06/23 0341  Weight: (!) 148.1 kg (!) 142.8 kg (!) 142.8 kg   Examination: General: laying in bed comfortably HENT: NCAT Lungs: CTAB, no adventitious sounds Cardiovascular: NSR, good pluses Abdomen: No TTP, normal bowel sounds Extremities: no asymmetry, good bulk and tone.  Neuro: alert and oriented x4 GU: no foley present  Labs   CBC: Recent Labs  Lab 09/04/23 0757 09/04/23 0758 09/04/23 1248 09/05/23 0317 09/06/23 0631  WBC  --  16.4* 10.6* 7.0 6.1  NEUTROABS  --  10.1*  --   --   --   HGB 13.3 13.3 12.1 10.8* 10.3*  HCT 39.0 41.5 36.6 33.3* 31.9*  MCV  --  90.0 88.2 87.4 87.4  PLT  --  316 213 207 196   Basic Metabolic Panel: Recent Labs  Lab 09/04/23 0749 09/04/23 0757 09/05/23 0317  NA 138 137 137  K 3.1* 3.5 3.6  CL 104  --  109  CO2 18*  --  22  GLUCOSE 244*  --  110*  BUN 13  --  10  CREATININE 1.21*  --  0.75  CALCIUM 8.6*   --  8.3*   GFR: Estimated Creatinine Clearance: 161 mL/min (by C-G formula based on SCr of 0.75 mg/dL). Recent Labs  Lab 09/04/23 0746 09/04/23 0758 09/04/23 1025 09/04/23 1248 09/05/23 0317 09/06/23 0631  WBC  --  16.4*  --  10.6* 7.0 6.1  LATICACIDVEN 6.5*  --  2.4* 2.1*  --   --    Liver Function Tests: Recent Labs  Lab 09/04/23 0749  AST 19  ALT 18  ALKPHOS 62  BILITOT 0.6  PROT 7.2  ALBUMIN 3.8   No results for input(s): "LIPASE", "AMYLASE" in the last 168 hours. No results for input(s): "AMMONIA" in the last 168 hours. ABG    Component Value Date/Time   PHART 7.274 (L) 09/04/2023 0757   PCO2ART 26.7 (L) 09/04/2023 0757   PO2ART 345 (H) 09/04/2023 0757   HCO3 12.4 (L) 09/04/2023 0757   TCO2 13 (L) 09/04/2023 0757   ACIDBASEDEF 13.0 (H) 09/04/2023 0757   O2SAT 100 09/04/2023 0757    Coagulation Profile: Recent Labs  Lab 09/04/23 0748 09/04/23 1248  INR 1.0 1.2   Cardiac Enzymes: No results for input(s): "CKTOTAL", "CKMB", "CKMBINDEX", "TROPONINI" in the last 168 hours. HbA1C: Hgb A1c MFr Bld  Date/Time Value Ref Range Status  09/05/2023 03:17 AM 5.0 4.8 - 5.6 % Final    Comment:    (NOTE) Pre diabetes:          5.7%-6.4%  Diabetes:              >6.4%  Glycemic control for   <7.0% adults with diabetes    CBG: Recent Labs  Lab 09/04/23 0740 09/04/23 1201  GLUCAP 155* 95   Consults  IR  Assessment & Plan:  Principal Problem:   Bilateral pulmonary embolism (HCC) Active Problems:   Pulmonary embolism (HCC)   DVT (deep venous thrombosis) (HCC)  Bilateral Pulmonary Embolism Acute DVT of Right LE Pt status post thrombectomy on 12/03. Appreciate IR consultation. On Eliquis 10 mg BID for now. On room air but desatted to 88 % with ambulation. Some desaturation reported during sleep. Has improved and will continue to monitor with likely plan for discharge tomorrow. Pt states she does not want to discharge on oxygen as she has unstable housing.  Given her improvement in short interval, I believe continued PT, and movement and using ICS, she may be able to discharge without oxygen tomorrow. Will place on continuous pulse ox to look for overnight desaturations.   Acute Hypoxic Respiratory Failure: Improving. likely 2/2  problem 1. Resolving.   Loss Of Consciousness: No further episode.  MDD/GAD: Chronic. Will continue home lexapro  Poor Social Determinants of Health: Consult TOC for unhomed status to provide resources   Best Practice (right click and "Reselect all SmartList Selections" daily)  Diet/type: Regular DVT prophylaxis: DOAC GI prophylaxis: N/A Lines: N/A Foley:  N/A and Yes, and it is still needed Continuous: Heparin  Code Status:  full code Last date of multidisciplinary goals of care discussion [12/05]

## 2023-09-06 NOTE — Progress Notes (Signed)
Pt ambulated in hallway very well. Pt walked around 350 feet. Pt maintained above 90% on RA.

## 2023-09-06 NOTE — Discharge Summary (Signed)
Physician Discharge Summary  Patient ID: Beth [name] MRN: [id number] DOB/AGE: Jul 17, 1991 32 y.o.  Admit date: 09/04/2023 Discharge date: 09/06/2023  Admission Diagnoses:  Discharge Diagnoses:  Principal Problem:   Bilateral pulmonary embolism (HCC) Active Problems:   Pulmonary embolism (HCC)   DVT (deep venous thrombosis) (HCC)   Discharged Condition: good  Hospital Course: 32 year old woman with a past medical history of major depressive illness and generalized anxiety.  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  Found in her vehicle hypoxic and tachycardic.  Reported episodes of syncope over the receiving 24 hours.  Oximetry was negative for carbon monoxide.  CT PE protocol showed submassive saddle pulmonary embolism with right heart strain which was confirmed by echocardiography.  She was systemically anticoagulated and underwent successful bilateral cath directed thrombectomy with improvement in her PA pressures and symptomatology.  She had noted a swollen right leg and a venous duplex Doppler confirmed a right leg DVT. Her symptoms having improved she was transitioned to Eliquis and arrangements were made for drug assistance.  She was ambulated successfully on room air and is ready for discharge.  Consults: pulmonary/intensive care and interventional radiology  Significant Diagnostic Studies: cardiac graphics: Echocardiogram: Showing significant RV strain .  CT PE showing bilateral pulmonary emboli including obstructive thrombus on right side.  Treatments: Anticoagulation with heparin.  Bilateral catheter directed thrombectomy.  Discharge Exam: Blood pressure 107/72, pulse 72, temperature 98.2 F (36.8 C), temperature source Oral, resp. rate 20, height 6' (1.829 m), weight (!) 142.8 kg, SpO2 91%. General appearance: alert, cooperative, no distress, and moderately obese Resp: clear to auscultation bilaterally Cardio: regular rate and rhythm,  S1, S2 normal, no murmur, click, rub or gallop Extremities: Swelling right leg Neurologic: Grossly normal  Disposition: Discharge to community.  Arrangements made for homeless shelter.   Allergies as of 09/06/2023   No Known Allergies      Medication List     STOP taking these medications    acetaminophen 325 MG tablet Commonly known as: TYLENOL   ibuprofen 200 MG tablet Commonly known as: ADVIL   oxyCODONE 5 MG immediate release tablet Commonly known as: Oxy IR/ROXICODONE   oxymetazoline 0.05 % nasal spray Commonly known as: AFRIN       TAKE these medications    apixaban 5 MG Tabs tablet Commonly known as: ELIQUIS Take 2 tablets (10 mg total) by mouth 2 (two) times daily for 5 days, THEN 1 tablet (5 mg total) 2 (two) times daily. Start taking on: September 06, 2023   escitalopram 10 MG tablet Commonly known as: LEXAPRO Take 1 tablet (10 mg total) by mouth daily.   sodium chloride 0.65 % Soln nasal spray Commonly known as: OCEAN Place 1 spray into both nostrils as needed for congestion.        Follow-up Information     Grayce Sessions, NP Follow up.   Specialty: Internal Medicine Why: TIME: 9:30 AM ARRIVE AT 9:15 AM DATE: DECEMBER 18 ,2024 Contact information: [address] [place] [address] [phone]         Hunsucker, Lesia Sago, MD Follow up in 1 month(s).   Specialty: Pulmonary Disease Contact information: [address] [place] [address] [phone]                 Signed: Lynnell Catalan 09/06/2023, 6:06 PM

## 2023-09-06 NOTE — Plan of Care (Incomplete)

## 2023-09-06 NOTE — Evaluation (Signed)
Physical Therapy Evaluation & Discharge Patient Details Name: Beth [name] MRN: [id number] DOB: 05-Apr-1991 Today's Date: 09/06/2023  History of Present Illness  32 y.o. female admitted 09/04/23 after seizure-like episode with fall, hypoxia. CTA showed bilateral PE with R heart strain. S/p bilateral thrombectomy 12/3. EEG negative for seizures. PMH includes anxiety, unhoused.   Clinical Impression  Patient evaluated by Physical Therapy with no further acute PT needs identified. PTA, pt independent, lives out of her car, uses local gym for showering/etc. Today, pt independent with mobility and self-care tasks, has non-productive cough and DOE with brief activity bouts. Educ re: pulmonary hygiene, activity recommendations, energy conservation strategies. All education has been completed and the patient has no further questions. Acute PT is signing off. Thank you for this referral.    SpO2 88% on RA with activity     If plan is discharge home, recommend the following: Assist for transportation   Can travel by private vehicle        Equipment Recommendations None recommended by PT  Recommendations for Other Services   Mobility Specialist   Functional Status Assessment Patient has had a recent decline in their functional status and demonstrates the ability to make significant improvements in function in a reasonable and predictable amount of time.     Precautions / Restrictions Restrictions Weight Bearing Restrictions: No      Mobility  Bed Mobility Overal bed mobility: Independent                  Transfers Overall transfer level: Independent Equipment used: None                    Ambulation/Gait Ambulation/Gait assistance: Independent   Assistive device: None Gait Pattern/deviations: WFL(Within Functional Limits)          Stairs            Wheelchair Mobility     Tilt Bed    Modified Rankin (Stroke Patients Only)       Balance Overall  balance assessment: No apparent balance deficits (not formally assessed)             Standing balance comment: indep with standing ADL tasks at sink (washing hands, brushing teeth, shampoo cap for hair; intermittent sitting rest breaks secondary to fatigue and SOB)                             Pertinent Vitals/Pain Pain Assessment Pain Assessment: No/denies pain    Home Living Family/patient expects to be discharged to:: Shelter/Homeless                   Additional Comments: has been living in car since 01/2023, showers at [employer]    Prior Function Prior Level of Function : Independent/Modified Independent                     Extremity/Trunk Assessment   Upper Extremity Assessment Upper Extremity Assessment: Overall WFL for tasks assessed    Lower Extremity Assessment Lower Extremity Assessment: Overall WFL for tasks assessed    Cervical / Trunk Assessment Cervical / Trunk Assessment: Normal  Communication   Communication Communication: No apparent difficulties  Cognition Arousal: Alert Behavior During Therapy: WFL for tasks assessed/performed Overall Cognitive Status: Within Functional Limits for tasks assessed  General Comments General comments (skin integrity, edema, etc.): SpO2 88% on RA with activity. educ re: POC, activity recommendations, energy conservation strategies, importance of mobility, pulmonary hygiene (including incentive spirometer)    Exercises Other Exercises Other Exercises: incentive spirometer x8   Assessment/Plan    PT Assessment Patient does not need any further PT services  PT Problem List         PT Treatment Interventions      PT Goals (Current goals can be found in the Care Plan section)  Acute Rehab PT Goals PT Goal Formulation: All assessment and education complete, DC therapy    Frequency       Co-evaluation                AM-PAC PT "6 Clicks" Mobility  Outcome Measure Help needed turning from your back to your side while in a flat bed without using bedrails?: None Help needed moving from lying on your back to sitting on the side of a flat bed without using bedrails?: None Help needed moving to and from a bed to a chair (including a wheelchair)?: None Help needed standing up from a chair using your arms (e.g., wheelchair or bedside chair)?: None Help needed to walk in hospital room?: None Help needed climbing 3-5 steps with a railing? : None 6 Click Score: 24    End of Session   Activity Tolerance: Patient tolerated treatment well Patient left: in bed;with call bell/phone within reach Nurse Communication: Mobility status PT Visit Diagnosis: Other abnormalities of gait and mobility (R26.89)    Time: 1324-4010 PT Time Calculation (min) (ACUTE ONLY): 30 min   Charges:   PT Evaluation $PT Eval Low Complexity: 1 Low PT Treatments $Therapeutic Activity: 8-22 mins PT General Charges $$ ACUTE PT VISIT: 1 Visit       Ina Homes, PT, DPT Acute Rehabilitation Services  Personal: Secure Chat Rehab Office: [phone]   Malachy Chamber 09/06/2023, 9:55 AM

## 2023-09-06 NOTE — Plan of Care (Signed)

## 2023-09-07 ENCOUNTER — Other Ambulatory Visit (HOSPITAL_COMMUNITY): Payer: Self-pay

## 2023-09-07 ENCOUNTER — Encounter (HOSPITAL_COMMUNITY): Payer: Self-pay | Admitting: Pulmonary Disease

## 2023-09-07 LAB — CBC
HCT: 31.8 % — ABNORMAL LOW (ref 36.0–46.0)
Hemoglobin: 10.5 g/dL — ABNORMAL LOW (ref 12.0–15.0)
MCH: 28.3 pg (ref 26.0–34.0)
MCHC: 33 g/dL (ref 30.0–36.0)
MCV: 85.7 fL (ref 80.0–100.0)
Platelets: 216 10*3/uL (ref 150–400)
RBC: 3.71 MIL/uL — ABNORMAL LOW (ref 3.87–5.11)
RDW: 12.5 % (ref 11.5–15.5)
WBC: 5.9 10*3/uL (ref 4.0–10.5)
nRBC: 0 % (ref 0.0–0.2)

## 2023-09-07 MED ORDER — ESCITALOPRAM OXALATE 10 MG PO TABS
10.0000 mg | ORAL_TABLET | Freq: Every day | ORAL | 2 refills | Status: AC
  Filled 2023-09-07: qty 30, 30d supply, fill #0
  Filled 2023-10-28: qty 30, 30d supply, fill #1
  Filled 2023-11-28: qty 30, 30d supply, fill #2

## 2023-09-07 NOTE — Progress Notes (Signed)
Pt's RN Felipa Eth reviewed discharge instructions with pt. Pt needed a refill on her lexapro medication. Pt's RN notified MD, and [facility] is working on filling that script with her Eliquis and medications will be picked up on the way out for discharge. Pt states her ride is coming at approx 3:30pm New AVS printed for pt and given to her.   Annice Needy, RN SWOT

## 2023-09-07 NOTE — Plan of Care (Signed)
  Problem: Education: Goal: Knowledge of General Education information will improve Description: Including pain rating scale, medication(s)/side effects and non-pharmacologic comfort measures Outcome: Progressing   Problem: Clinical Measurements: Goal: Respiratory complications will improve Outcome: Progressing   Problem: Coping: Goal: Level of anxiety will decrease Outcome: Progressing   Problem: Activity: Goal: Risk for activity intolerance will decrease Outcome: Progressing   Problem: Pain Management: Goal: General experience of comfort will improve Outcome: Progressing   Problem: Safety: Goal: Ability to remain free from injury will improve Outcome: Progressing

## 2023-09-07 NOTE — Progress Notes (Signed)
Mobility Specialist Progress Note:   09/07/23 1100  Mobility  Activity Ambulated independently in hallway  Level of Assistance Independent  Assistive Device None  Distance Ambulated (ft) 150 ft  Activity Response Tolerated well  Mobility Referral Yes  Mobility visit 1 Mobility  Mobility Specialist Start Time (ACUTE ONLY) 1108  Mobility Specialist Stop Time (ACUTE ONLY) 1115  Mobility Specialist Time Calculation (min) (ACUTE ONLY) 7 min   Pt received in bed, agreeable to mobility session. Ambulated in hallway, no AD required. SpO2 97-99% on RA throughout session, max HR 128 bpm. Returned to room w/o fault. Left with all needs met.   Feliciana Rossetti Mobility Specialist Please contact via Special educational needs teacher or  Rehab office at [phone]

## 2023-09-09 LAB — CULTURE, BLOOD (ROUTINE X 2)
Culture: NO GROWTH
Culture: NO GROWTH
Special Requests: ADEQUATE

## 2023-09-18 ENCOUNTER — Telehealth (INDEPENDENT_AMBULATORY_CARE_PROVIDER_SITE_OTHER): Payer: Self-pay | Admitting: Primary Care

## 2023-09-18 NOTE — Telephone Encounter (Signed)
 Called pt to remind them about apt. Could not leave VM.

## 2023-09-19 ENCOUNTER — Other Ambulatory Visit: Payer: Self-pay

## 2023-09-19 ENCOUNTER — Ambulatory Visit (INDEPENDENT_AMBULATORY_CARE_PROVIDER_SITE_OTHER): Payer: Self-pay | Admitting: Primary Care

## 2023-09-19 ENCOUNTER — Encounter (INDEPENDENT_AMBULATORY_CARE_PROVIDER_SITE_OTHER): Payer: Self-pay | Admitting: Primary Care

## 2023-09-19 VITALS — BP 104/71 | HR 81 | Resp 16 | Ht 72.0 in | Wt 304.6 lb

## 2023-09-19 DIAGNOSIS — Z7689 Persons encountering health services in other specified circumstances: Secondary | ICD-10-CM

## 2023-09-19 DIAGNOSIS — E66813 Obesity, class 3: Secondary | ICD-10-CM

## 2023-09-19 DIAGNOSIS — I82491 Acute embolism and thrombosis of other specified deep vein of right lower extremity: Secondary | ICD-10-CM

## 2023-09-19 DIAGNOSIS — Z6841 Body Mass Index (BMI) 40.0 and over, adult: Secondary | ICD-10-CM

## 2023-09-19 DIAGNOSIS — G44229 Chronic tension-type headache, not intractable: Secondary | ICD-10-CM

## 2023-09-19 DIAGNOSIS — Z2821 Immunization not carried out because of patient refusal: Secondary | ICD-10-CM

## 2023-09-19 DIAGNOSIS — Z09 Encounter for follow-up examination after completed treatment for conditions other than malignant neoplasm: Secondary | ICD-10-CM

## 2023-09-19 MED ORDER — ACETAMINOPHEN 500 MG PO TABS
500.0000 mg | ORAL_TABLET | Freq: Four times a day (QID) | ORAL | 0 refills | Status: AC | PRN
  Filled 2023-09-19: qty 30, 8d supply, fill #0

## 2023-09-19 NOTE — Progress Notes (Signed)
Subjective:  Beth [name] is a 32 y.o. morbid obese female presents for hospital follow up and establish care. Presented to the ED  with multiple episodes of syncope and presyncope with some seizure like activity. Brought in to [facility] by a friend. She reported shortness of breath and was found to be hypoxic in the ED.  Admit date to the hospital was 09/04/23, patient was discharged from the hospital on 09/07/23, patient was admitted for: Bilateral pulmonary embolism (HCC), Pulmonary embolism (HCC) and.DVT (deep venous thrombosis) .  Prior to seeing patient reviewed hospitalization and call pulmonology to clarify that January 6, /2025 date was appropriate.  Patient has been compliant with Eliquis and her appointment date is appropriate. Today she headache, No chest pain, No abdominal pain - No Nausea, No new weakness tingling or numbness, No Cough - shortness of breath . She did voice scant amount of bleeding more than usual on her menstrual cycle.  Past Medical History:  Diagnosis Date   Anxiety      No Known Allergies  Current Outpatient Medications on File Prior to Visit  Medication Sig Dispense Refill   apixaban (ELIQUIS) 5 MG TABS tablet Take 2 tablets (10 mg total) by mouth 2 (two) times daily for 5 days, THEN 1 tablet (5 mg total) 2 (two) times daily. 60 tablet 3   escitalopram (LEXAPRO) 10 MG tablet Take 1 tablet (10 mg total) by mouth daily. 30 tablet 2   sodium chloride (OCEAN) 0.65 % SOLN nasal spray Place 1 spray into both nostrils as needed for congestion.     No current facility-administered medications on file prior to visit.    Review of System: Comprehensive ROS Pertinent positive and negative noted in HPI   Objective:  BP 104/71   Pulse 81   Resp 16   Ht 6' (1.829 m)   Wt (!) 304 lb 9.6 oz (138.2 kg)   SpO2 97%   BMI 41.31 kg/m   Filed Weights   09/19/23 1006  Weight: (!) 304 lb 9.6 oz (138.2 kg)    Physical Exam Vitals reviewed.   Constitutional:      Appearance: She is obese.     Comments: morbid  HENT:     Head: Normocephalic.     Right Ear: External ear normal. There is impacted cerumen.     Left Ear: External ear normal. There is impacted cerumen.     Nose: Nose normal.     Mouth/Throat:     Mouth: Mucous membranes are moist.  Eyes:     Extraocular Movements: Extraocular movements intact.     Pupils: Pupils are equal, round, and reactive to light.  Cardiovascular:     Rate and Rhythm: Normal rate.  Pulmonary:     Effort: Pulmonary effort is normal.     Breath sounds: Normal breath sounds.  Abdominal:     General: Bowel sounds are normal.     Palpations: Abdomen is soft.  Musculoskeletal:        General: Normal range of motion.     Cervical back: Normal range of motion.  Skin:    General: Skin is warm and dry.  Neurological:     Mental Status: She is alert and oriented to person, place, and time.  Psychiatric:        Mood and Affect: Mood normal.        Behavior: Behavior normal.        Thought Content: Thought content normal.  Assessment:   [name] was seen today for hospitalization follow-up.  Diagnoses and all orders for this visit:  Encounter to establish care  Influenza vaccination declined  Tetanus, diphtheria, and acellular pertussis (Tdap) vaccination declined  Chronic tension-type headache, not intractable -     acetaminophen (TYLENOL) 500 MG tablet; Take 1 tablet (500 mg total) by mouth every 6 (six) hours as needed.  Hospital discharge follow-up See HPI   Morbid obesity (HCC) Discussed diet and exercise for person with BMI >25. Instructed: You must burn more calories than you eat. Losing 5 percent of your body weight should be considered a success. In the longer term, losing more than 15 percent of your body weight and staying at this weight is an extremely good result. However, keep in mind that even losing 5 percent of your body weight leads to important health  benefits, so try not to get discouraged if you're not able to lose more than this. Will recheck weight in 3-6 months.     [name] was seen today for hospitalization follow-up.  Diagnoses and all orders for this visit:  Encounter to establish care  Influenza vaccination declined  Tetanus, diphtheria, and acellular pertussis (Tdap) vaccination declined  Chronic tension-type headache, not intractable -     acetaminophen (TYLENOL) 500 MG tablet; Take 1 tablet (500 mg total) by mouth every 6 (six) hours as needed.  Hospital discharge follow-up  Morbid obesity (HCC)  Acute deep vein thrombosis (DVT) of other specified vein of right lower extremity [facility])    This note has been created with Education officer, environmental. Any transcriptional errors are unintentional.   No follow-ups on file.  Grayce Sessions, NP 09/19/2023, 10:12 AM

## 2023-09-27 ENCOUNTER — Other Ambulatory Visit: Payer: Self-pay

## 2023-09-27 ENCOUNTER — Emergency Department (HOSPITAL_COMMUNITY): Payer: MEDICAID

## 2023-09-27 ENCOUNTER — Emergency Department (HOSPITAL_COMMUNITY)
Admission: EM | Admit: 2023-09-27 | Discharge: 2023-09-28 | Discharge status: Against Medical Advice | Payer: MEDICAID | Attending: Emergency Medicine | Admitting: Emergency Medicine

## 2023-09-27 ENCOUNTER — Encounter (HOSPITAL_COMMUNITY): Payer: Self-pay

## 2023-09-27 DIAGNOSIS — R55 Syncope and collapse: Secondary | ICD-10-CM | POA: Insufficient documentation

## 2023-09-27 DIAGNOSIS — S12091A Other nondisplaced fracture of first cervical vertebra, initial encounter for closed fracture: Secondary | ICD-10-CM | POA: Insufficient documentation

## 2023-09-27 DIAGNOSIS — Y93E1 Activity, personal bathing and showering: Secondary | ICD-10-CM | POA: Insufficient documentation

## 2023-09-27 DIAGNOSIS — S0081XA Abrasion of other part of head, initial encounter: Secondary | ICD-10-CM | POA: Insufficient documentation

## 2023-09-27 DIAGNOSIS — Z7901 Long term (current) use of anticoagulants: Secondary | ICD-10-CM | POA: Insufficient documentation

## 2023-09-27 DIAGNOSIS — W01198A Fall on same level from slipping, tripping and stumbling with subsequent striking against other object, initial encounter: Secondary | ICD-10-CM | POA: Insufficient documentation

## 2023-09-27 LAB — COMPREHENSIVE METABOLIC PANEL
ALT: 13 U/L (ref 0–44)
AST: 14 U/L — ABNORMAL LOW (ref 15–41)
Albumin: 3.3 g/dL — ABNORMAL LOW (ref 3.5–5.0)
Alkaline Phosphatase: 54 U/L (ref 38–126)
Anion gap: 10 (ref 5–15)
BUN: 13 mg/dL (ref 6–20)
CO2: 22 mmol/L (ref 22–32)
Calcium: 9.2 mg/dL (ref 8.9–10.3)
Chloride: 105 mmol/L (ref 98–111)
Creatinine, Ser: 0.88 mg/dL (ref 0.44–1.00)
GFR, Estimated: >60 mL/min (ref >60)
Glucose, Bld: 115 mg/dL — ABNORMAL HIGH (ref 70–99)
Potassium: 3.3 mmol/L — ABNORMAL LOW (ref 3.5–5.1)
Sodium: 137 mmol/L (ref 135–145)
Total Bilirubin: 0.5 mg/dL (ref <1.2)
Total Protein: 6.9 g/dL (ref 6.5–8.1)

## 2023-09-27 LAB — CBC WITH DIFFERENTIAL/PLATELET
Abs Immature Granulocytes: 0.04 10*3/uL (ref 0.00–0.07)
Basophils Absolute: 0.1 10*3/uL (ref 0.0–0.1)
Basophils Relative: 1 %
Eosinophils Absolute: 0.1 10*3/uL (ref 0.0–0.5)
Eosinophils Relative: 2 %
HCT: 39.7 % (ref 36.0–46.0)
Hemoglobin: 12.7 g/dL (ref 12.0–15.0)
Immature Granulocytes: 1 %
Lymphocytes Relative: 27 %
Lymphs Abs: 2.3 10*3/uL (ref 0.7–4.0)
MCH: 28.6 pg (ref 26.0–34.0)
MCHC: 32 g/dL (ref 30.0–36.0)
MCV: 89.4 fL (ref 80.0–100.0)
Monocytes Absolute: 0.6 10*3/uL (ref 0.1–1.0)
Monocytes Relative: 7 %
Neutro Abs: 5.4 10*3/uL (ref 1.7–7.7)
Neutrophils Relative %: 62 %
Platelets: 268 10*3/uL (ref 150–400)
RBC: 4.44 MIL/uL (ref 3.87–5.11)
RDW: 12.8 % (ref 11.5–15.5)
WBC: 8.5 10*3/uL (ref 4.0–10.5)
nRBC: 0 % (ref 0.0–0.2)

## 2023-09-27 LAB — SAMPLE TO BLOOD BANK

## 2023-09-27 MED ORDER — IOHEXOL 350 MG/ML SOLN
75.0000 mL | Freq: Once | INTRAVENOUS | Status: AC | PRN
  Administered 2023-09-27: 75 mL via INTRAVENOUS

## 2023-09-27 MED ORDER — SODIUM CHLORIDE 0.9 % IV BOLUS: 1000.0000 mL | Freq: Once | INTRAVENOUS | Status: DC

## 2023-09-27 MED ORDER — SODIUM CHLORIDE 0.9 % IV BOLUS
1000.0000 mL | Freq: Once | INTRAVENOUS | Status: AC
  Administered 2023-09-27: 1000 mL via INTRAVENOUS

## 2023-09-27 NOTE — ED Triage Notes (Signed)
Called to triage by NT. Pt reports getting out of the shower and feeling faint. Pt had syncopal episode witnessed by mother in the hallway, striking the R side of her face against the carpet. Pt anticoagulated on Eliquis. Hypotensive during triage. Charge RN notified and Level 2 activated.

## 2023-09-27 NOTE — ED Provider Notes (Signed)
Beth [name] EMERGENCY DEPARTMENT AT [facility] Provider Note   CSN: [id number] Arrival date & time: 09/27/23  1801     History  Chief Complaint  Patient presents with   Loss of Consciousness    Beth [name] is a 32 y.o. female.   Loss of Consciousness    Patient presents ED for evaluation after a syncopal episode.  Patient has history of pulmonary embolism.  She is on anticoagulation.  Patient was taking a shower when she suddenly started to feel hot and flushed.  She felt like she was going to faint.  Patient ended up getting out of the shower and fainting striking her face on the ground.  Patient was initially noted to be hypotensive on arrival.  She denies any blood in her stool.  She is not having any chest pain.  She denies any shortness of breath.  No abdominal pain.  Home Medications Prior to Admission medications   Medication Sig Start Date End Date Taking? Authorizing Provider  acetaminophen (TYLENOL) 500 MG tablet Take 1 tablet (500 mg total) by mouth every 6 (six) hours as needed. 09/19/23   Grayce Sessions, NP  apixaban (ELIQUIS) 5 MG TABS tablet Take 2 tablets (10 mg total) by mouth 2 (two) times daily for 5 days, THEN 1 tablet (5 mg total) 2 (two) times daily. 09/06/23 03/09/24  Lynnell Catalan, MD  escitalopram (LEXAPRO) 10 MG tablet Take 1 tablet (10 mg total) by mouth daily. 09/07/23 12/06/23  Gwenevere Abbot, MD  sodium chloride (OCEAN) 0.65 % SOLN nasal spray Place 1 spray into both nostrils as needed for congestion. 09/06/23   Lynnell Catalan, MD      Allergies    Patient has no known allergies.    Review of Systems   Review of Systems  Cardiovascular:  Positive for syncope.    Physical Exam Updated Vital Signs BP 114/62   Pulse (!) 58   Temp 98.5 F (36.9 C)   Resp 10   Ht 1.829 m (6')   Wt (!) 137.9 kg   SpO2 100%   BMI 41.23 kg/m  Physical Exam Vitals and nursing note reviewed.  Constitutional:      Appearance: She is  well-developed.  HENT:     Head: Normocephalic.     Comments: Abrasion noted right cheek, tenderness palpation    Right Ear: External ear normal.     Left Ear: External ear normal.  Eyes:     General: No scleral icterus.       Right eye: No discharge.        Left eye: No discharge.     Conjunctiva/sclera: Conjunctivae normal.  Neck:     Trachea: No tracheal deviation.  Cardiovascular:     Rate and Rhythm: Normal rate and regular rhythm.  Pulmonary:     Effort: Pulmonary effort is normal. No respiratory distress.     Breath sounds: Normal breath sounds. No stridor. No wheezing or rales.  Abdominal:     General: Bowel sounds are normal. There is no distension.     Palpations: Abdomen is soft.     Tenderness: There is no abdominal tenderness. There is no guarding or rebound.  Musculoskeletal:        General: No deformity.     Cervical back: Neck supple. Tenderness present. No bony tenderness.     Thoracic back: No tenderness.     Lumbar back: No tenderness.  Skin:    General: Skin is warm and  dry.     Findings: No rash.  Neurological:     General: No focal deficit present.     Mental Status: She is alert.     Cranial Nerves: No cranial nerve deficit, dysarthria or facial asymmetry.     Sensory: No sensory deficit.     Motor: No abnormal muscle tone or seizure activity.     Coordination: Coordination normal.  Psychiatric:        Mood and Affect: Mood normal.     ED Results / Procedures / Treatments   Labs (all labs ordered are listed, but only abnormal results are displayed) Labs Reviewed  COMPREHENSIVE METABOLIC PANEL - Abnormal; Notable for the following components:      Result Value   Potassium 3.3 (*)    Glucose, Bld 115 (*)    Albumin 3.3 (*)    AST 14 (*)    All other components within normal limits  CBC WITH DIFFERENTIAL/PLATELET  SAMPLE TO BLOOD BANK    EKG EKG Interpretation Date/Time:  Thursday September 27 2023 18:22:49 EST Ventricular Rate:  59 PR  Interval:  166 QRS Duration:  89 QT Interval:  439 QTC Calculation: 435 R Axis:   79  Text Interpretation: Sinus arrhythmia ST elev, probable normal early repol pattern Since last tracing rate slower Confirmed by Linwood Dibbles [phone]) on 09/27/2023 6:55:34 PM  Radiology CT Head Wo Contrast Result Date: 09/27/2023 CLINICAL DATA:  Blunt facial trauma. Patient fell pain getting out of the shower and had a syncopal episode witnessed by mother. Patient struck right side of face against carpet. Anticoagulated on Eliquis. Hypotensive in triage. Level 2 trauma activated. EXAM: CT HEAD WITHOUT CONTRAST CT MAXILLOFACIAL WITHOUT CONTRAST CT CERVICAL SPINE WITHOUT CONTRAST TECHNIQUE: Multidetector CT imaging of the head, cervical spine, and maxillofacial structures were performed using the standard protocol without intravenous contrast. Multiplanar CT image reconstructions of the cervical spine and maxillofacial structures were also generated. RADIATION DOSE REDUCTION: This exam was performed according to the departmental dose-optimization program which includes automated exposure control, adjustment of the mA and/or kV according to patient size and/or use of iterative reconstruction technique. COMPARISON:  CT head 09/04/2023 FINDINGS: CT HEAD FINDINGS Brain: No evidence of acute infarction, hemorrhage, hydrocephalus, extra-axial collection or mass lesion/mass effect. Vascular: No hyperdense vessel or unexpected calcification. Skull: Normal. Negative for fracture or focal lesion. Other: None. CT MAXILLOFACIAL FINDINGS Osseous: No fracture or mandibular dislocation. Dental caries and periapical lucencies in several maxillary and mandibular teeth. Orbits: Negative. No traumatic or inflammatory finding. Sinuses: Clear. Soft tissues: Negative. CT CERVICAL SPINE FINDINGS Alignment: Loss of lordosis is likely chronic/positional. No evidence of traumatic listhesis. Skull base and vertebrae: Mildly displaced fractures of the  medial aspect of the left occipital condyle (circa series 9/image 04/21/2027. Nondisplaced fracture through the posterosuperior left lateral mass of C1 (series 10/image 65) extending into the groove for the vertebral artery. Soft tissues and spinal canal: No prevertebral fluid or swelling. No visible canal hematoma. Disc levels: Disc space height is maintained. No significant spinal canal narrowing. Upper chest: No acute abnormality. Other: None. IMPRESSION: 1. Mildly displaced fractures of the medial aspect of the left occipital condyle. 2. Nondisplaced fracture through the posterosuperior left lateral mass of C1 extending into the groove for the vertebral artery. CTA of the head and neck is recommended for further evaluation. 3. No acute intracranial abnormality. 4. No acute facial fracture. 5. Dental caries and periodontal disease in several maxillary and mandibular teeth. Critical Value/emergent results were called  by telephone at the time of interpretation on 09/27/2023 at 8:01 pm to provider [facility] MEREDITH ; Omair Dettmer , who verbally acknowledged these results. Electronically Signed   By: Minerva Fester M.D.   On: 09/27/2023 20:14   CT Cervical Spine Wo Contrast Result Date: 09/27/2023 CLINICAL DATA:  Blunt facial trauma. Patient fell pain getting out of the shower and had a syncopal episode witnessed by mother. Patient struck right side of face against carpet. Anticoagulated on Eliquis. Hypotensive in triage. Level 2 trauma activated. EXAM: CT HEAD WITHOUT CONTRAST CT MAXILLOFACIAL WITHOUT CONTRAST CT CERVICAL SPINE WITHOUT CONTRAST TECHNIQUE: Multidetector CT imaging of the head, cervical spine, and maxillofacial structures were performed using the standard protocol without intravenous contrast. Multiplanar CT image reconstructions of the cervical spine and maxillofacial structures were also generated. RADIATION DOSE REDUCTION: This exam was performed according to the departmental dose-optimization  program which includes automated exposure control, adjustment of the mA and/or kV according to patient size and/or use of iterative reconstruction technique. COMPARISON:  CT head 09/04/2023 FINDINGS: CT HEAD FINDINGS Brain: No evidence of acute infarction, hemorrhage, hydrocephalus, extra-axial collection or mass lesion/mass effect. Vascular: No hyperdense vessel or unexpected calcification. Skull: Normal. Negative for fracture or focal lesion. Other: None. CT MAXILLOFACIAL FINDINGS Osseous: No fracture or mandibular dislocation. Dental caries and periapical lucencies in several maxillary and mandibular teeth. Orbits: Negative. No traumatic or inflammatory finding. Sinuses: Clear. Soft tissues: Negative. CT CERVICAL SPINE FINDINGS Alignment: Loss of lordosis is likely chronic/positional. No evidence of traumatic listhesis. Skull base and vertebrae: Mildly displaced fractures of the medial aspect of the left occipital condyle (circa series 9/image 04/21/2027. Nondisplaced fracture through the posterosuperior left lateral mass of C1 (series 10/image 65) extending into the groove for the vertebral artery. Soft tissues and spinal canal: No prevertebral fluid or swelling. No visible canal hematoma. Disc levels: Disc space height is maintained. No significant spinal canal narrowing. Upper chest: No acute abnormality. Other: None. IMPRESSION: 1. Mildly displaced fractures of the medial aspect of the left occipital condyle. 2. Nondisplaced fracture through the posterosuperior left lateral mass of C1 extending into the groove for the vertebral artery. CTA of the head and neck is recommended for further evaluation. 3. No acute intracranial abnormality. 4. No acute facial fracture. 5. Dental caries and periodontal disease in several maxillary and mandibular teeth. Critical Value/emergent results were called by telephone at the time of interpretation on 09/27/2023 at 8:01 pm to provider [facility] MEREDITH ; Deadrian Toya , who  verbally acknowledged these results. Electronically Signed   By: Minerva Fester M.D.   On: 09/27/2023 20:14   CT Maxillofacial Wo Contrast Result Date: 09/27/2023 CLINICAL DATA:  Blunt facial trauma. Patient fell pain getting out of the shower and had a syncopal episode witnessed by mother. Patient struck right side of face against carpet. Anticoagulated on Eliquis. Hypotensive in triage. Level 2 trauma activated. EXAM: CT HEAD WITHOUT CONTRAST CT MAXILLOFACIAL WITHOUT CONTRAST CT CERVICAL SPINE WITHOUT CONTRAST TECHNIQUE: Multidetector CT imaging of the head, cervical spine, and maxillofacial structures were performed using the standard protocol without intravenous contrast. Multiplanar CT image reconstructions of the cervical spine and maxillofacial structures were also generated. RADIATION DOSE REDUCTION: This exam was performed according to the departmental dose-optimization program which includes automated exposure control, adjustment of the mA and/or kV according to patient size and/or use of iterative reconstruction technique. COMPARISON:  CT head 09/04/2023 FINDINGS: CT HEAD FINDINGS Brain: No evidence of acute infarction, hemorrhage, hydrocephalus, extra-axial collection or mass lesion/mass  effect. Vascular: No hyperdense vessel or unexpected calcification. Skull: Normal. Negative for fracture or focal lesion. Other: None. CT MAXILLOFACIAL FINDINGS Osseous: No fracture or mandibular dislocation. Dental caries and periapical lucencies in several maxillary and mandibular teeth. Orbits: Negative. No traumatic or inflammatory finding. Sinuses: Clear. Soft tissues: Negative. CT CERVICAL SPINE FINDINGS Alignment: Loss of lordosis is likely chronic/positional. No evidence of traumatic listhesis. Skull base and vertebrae: Mildly displaced fractures of the medial aspect of the left occipital condyle (circa series 9/image 04/21/2027. Nondisplaced fracture through the posterosuperior left lateral mass of C1  (series 10/image 65) extending into the groove for the vertebral artery. Soft tissues and spinal canal: No prevertebral fluid or swelling. No visible canal hematoma. Disc levels: Disc space height is maintained. No significant spinal canal narrowing. Upper chest: No acute abnormality. Other: None. IMPRESSION: 1. Mildly displaced fractures of the medial aspect of the left occipital condyle. 2. Nondisplaced fracture through the posterosuperior left lateral mass of C1 extending into the groove for the vertebral artery. CTA of the head and neck is recommended for further evaluation. 3. No acute intracranial abnormality. 4. No acute facial fracture. 5. Dental caries and periodontal disease in several maxillary and mandibular teeth. Critical Value/emergent results were called by telephone at the time of interpretation on 09/27/2023 at 8:01 pm to provider [facility] MEREDITH ; Diasha Castleman , who verbally acknowledged these results. Electronically Signed   By: Minerva Fester M.D.   On: 09/27/2023 20:14   DG Chest Portable 1 View Result Date: 09/27/2023 CLINICAL DATA:  Trauma EXAM: PORTABLE CHEST 1 VIEW COMPARISON:  11/04/2022 FINDINGS: The heart size and mediastinal contours are within normal limits. Both lungs are clear. The visualized skeletal structures are unremarkable. No pneumothorax. IMPRESSION: Normal study Electronically Signed   By: Charlett Nose M.D.   On: 09/27/2023 19:13    Procedures Procedures    Medications Ordered in ED Medications  sodium chloride 0.9 % bolus 1,000 mL (0 mLs Intravenous Stopped 09/27/23 1936)  iohexol (OMNIPAQUE) 350 MG/ML injection 75 mL (75 mLs Intravenous Contrast Given 09/27/23 2059)    ED Course/ Medical Decision Making/ A&P Clinical Course as of 09/28/23 1118  Thu Sep 27, 2023  1912 Blood pressure is improved, 107/66 at the bedside [JK]  1912 CBC without anemia. [JK]  2020 1. Mildly displaced fractures of the medial aspect of the left occipital condyle. 2.  Nondisplaced fracture through the posterosuperior left lateral mass of C1 extending into the groove for the vertebral artery. CTA of the head and neck is recommended for further evaluation.  [JK]  2054 Case discussed with Dr. Lovell Beth [name].  Patient can be treated with cervical spine collar.  He does not necessarily think she needs to have the CT angiogram as the treatment would be anticoagulation which she is already on.  Patient was already brought over to the CT scanner [JK]  2140 Labs reviewed.  No anemia.  No significant metabolic abnormalities [JK]    Clinical Course User Index [JK] Linwood Dibbles, MD                                 Medical Decision Making Problems Addressed: Other closed nondisplaced fracture of first cervical vertebra, initial encounter [facility]): acute illness or injury that poses a threat to life or bodily functions Syncope and collapse: acute illness or injury that poses a threat to life or bodily functions  Amount and/or Complexity of Data Reviewed Labs:  ordered. Decision-making details documented in ED Course. Radiology: ordered and independent interpretation performed.  Risk Prescription drug management.   Presented to the ED for evaluation after syncopal episode.  Patient recently has been in the hospital for a pulmonary embolism.  Patient underwent a thrombectomy on December 3.  Patient denies any chest pain or shortness of breath.  Low suspicion for recurrent PE at this time as she is not having any respiratory symptoms.  She is not tachycardic tachypneic short of breath is not having any chest discomfort.  She has been compliant with her anticoagulation.  She felt lightheaded and flushed before having her syncopal episode.  Possible patient may have had a vasovagal episode however with her complex recent medical history I recommended admission to the hospital for further evaluation  Also noted to have a cervical spine fracture on her CT scans.  I discussed the case  with Dr. Lovell Beth [name].  Patient can be treated with a cervical collar.  I had a long discussion with the patient regarding my concerns about her syncopal episode.  Certainly possible that it could have been a vasovagal episode but with her recent complex history I think she would benefit.  Overnight monitoring to make sure her blood pressure remained stable and she does not have any cardiac dysrhythmias.  Patient is very concerned about the cost does not want to stay in the hospital.  She does not want to be admitted to the hospital.  Leaving AMA.  Patient will follow-up with her PCP.  I will have her follow-up with Dr. Lovell Beth [name] regarding her neck injury.  Patient understands she can return anytime        Final Clinical Impression(s) / ED Diagnoses Final diagnoses:  Syncope and collapse  Other closed nondisplaced fracture of first cervical vertebra, initial encounter [facility]    Rx / DC Orders ED Discharge Orders     None         Linwood Dibbles, MD 09/28/23 1118

## 2023-09-27 NOTE — Discharge Instructions (Addendum)
I have recommended admission to the hospital for overnight monitoring.  Please follow-up with your doctor to be rechecked.  Return to the ED if you have any recurrent episodes.  Follow-up up with Dr. Lovell Sheehan, neurosurgery regarding your spine injury.  Your CTA did not show any injury to your blood vessels.  Please keep the collar on at all times.  Contact his office to arrange a follow-up appointment

## 2023-09-27 NOTE — ED Notes (Signed)
Patient transported to CT with nurse

## 2023-09-27 NOTE — ED Provider Notes (Signed)
  Provider Note MRN:  [id number]  Arrival date & time: 09/28/23    ED Course and Medical Decision Making  Assumed care from Dr. Lynelle Doctor at shift change.  Syncope in the shower with head and neck trauma, CT imaging revealing 71 fracture.  Can be managed outpatient per neurosurgery.  Awaiting CTA head and neck to ensure no vascular injury.  Patient is already on anticoagulation and so from a trauma perspective would be okay to go home.  Patient did have soft blood pressures on arrival and had a recent admission for massive PE receiving thrombectomy.  The general plan is for admission for observation however patient is not interested in this, would like to sign out AMA.  12:07 AM update: I spoke with patient and confirmed the plan for leaving AGAINST MEDICAL ADVICE, she was fully aware of the risks of leaving including worsening of condition, death.  The CTA did not show any vascular issues.  She is instructed to return to the emergency department at any time for continued care.  Procedures  Final Clinical Impressions(s) / ED Diagnoses     ICD-10-CM   1. Syncope and collapse  R55     2. Other closed nondisplaced fracture of first cervical vertebra, initial encounter [facility]  S12.091A       ED Discharge Orders     None         Discharge Instructions      I have recommended admission to the hospital for overnight monitoring.  Please follow-up with your doctor to be rechecked.  Return to the ED if you have any recurrent episodes.  Follow-up up with Dr. Lovell Sheehan, neurosurgery regarding your spine injury.  Your CTA did not show any injury to your blood vessels.  Please keep the collar on at all times.  Contact his office to arrange a follow-up appointment    Elmer Sow. Pilar Plate, MD [facility] Emergency Medicine [facility] [email or web address]    Sabas Sous, MD 09/28/23 [phone]

## 2023-09-27 NOTE — ED Notes (Signed)
Patient returned from CT with primary nurse.

## 2023-09-27 NOTE — Progress Notes (Signed)
   09/27/23 1825  Spiritual Encounters  Type of Visit Initial  Care provided to: Patient;Family  Referral source Trauma page  Reason for visit Trauma  OnCall Visit Yes  Advance Directives (For Healthcare)  Does Patient Have a Medical Advance Directive? No  Would patient like information on creating a medical advance directive? No - Patient declined  Mental Health Advance Directives  Does Patient Have a Mental Health Advance Directive? No  Would patient like information on creating a mental health advance directive? No - Patient declined   Chaplain responded to trauma level II page. Patient's mother was present at bedside. Chaplain provided support to patient and staff. No follow-up needed at this time.

## 2023-10-01 ENCOUNTER — Other Ambulatory Visit: Payer: Self-pay

## 2023-10-01 ENCOUNTER — Encounter (INDEPENDENT_AMBULATORY_CARE_PROVIDER_SITE_OTHER): Payer: Self-pay | Admitting: Primary Care

## 2023-10-01 ENCOUNTER — Telehealth (INDEPENDENT_AMBULATORY_CARE_PROVIDER_SITE_OTHER): Payer: Self-pay | Admitting: Primary Care

## 2023-10-01 NOTE — Telephone Encounter (Signed)
Pt called around 8:25 to let PCP know that she was the one with PE and she was in the hospital. She would like to follow up with the Nurse or Provider.

## 2023-10-02 ENCOUNTER — Encounter (INDEPENDENT_AMBULATORY_CARE_PROVIDER_SITE_OTHER): Payer: Self-pay | Admitting: Primary Care

## 2023-10-10 ENCOUNTER — Telehealth: Payer: Self-pay | Admitting: Pulmonary Disease

## 2023-10-10 NOTE — Telephone Encounter (Signed)
 PT calling for Dr. Annella to make appt after pulmonary embolism surgery. He wanted to see her in a mo., she said.   His first avail was 2/27 and I made that appt. I did want to check to be sure this was OK. PT says she is feeling fine but perhaps Dr. Elmer a sooner appt w/SG or another NP. I told PT unless she hears from us  to keep this appt.we made today. TY

## 2023-10-13 NOTE — Telephone Encounter (Signed)
 Dr. Judeth Horn, please advise if the appt that was scheduled is okay. Believe that this is the first availability.

## 2023-10-15 NOTE — Telephone Encounter (Signed)
 Ok to get appt sooner for hosp f/u with NP if available - worth making sure she has medication access etc sooner than February. Please keep appt with me 2/27.

## 2023-10-26 NOTE — Telephone Encounter (Signed)
Patient scheduled 11/29/2023 with Dr. Judeth Horn.

## 2023-10-29 ENCOUNTER — Other Ambulatory Visit: Payer: Self-pay

## 2023-10-29 ENCOUNTER — Telehealth: Payer: Self-pay

## 2023-10-29 NOTE — Telephone Encounter (Signed)
Can we get her medication assistance paperwork for Eliquis?   Also on prior telephone encounter I requested she be seen by NP prior to appt with me in February in effort to make sure she has acces to medication - this was never done.

## 2023-10-29 NOTE — Telephone Encounter (Signed)
[facility] at [facility] is no longer able to assist patient with free Eliquis as she has not enrolled in the [employer] PAF program and has canceled appt with PCP. Patient will need to be seen by pulmonology and enroll in patient assistance program for refills or therapy will need to be changed. Patient has received 3 months of free medication.  1st fill via free trial card: 09/07/2023 2nd fill : 10/01/2023 3rd and final fill: 10/28/2023  **Patient will be responsible for out-of-pocket cost of medication for any further refills at [facility] pharmacies as patient is uninsured and not enrolled in a patient assistance program with the manufacturer.  Patient has an upcoming appointment with Pulmonology on 11/29/2023

## 2023-10-30 ENCOUNTER — Other Ambulatory Visit: Payer: Self-pay

## 2023-10-31 ENCOUNTER — Ambulatory Visit (INDEPENDENT_AMBULATORY_CARE_PROVIDER_SITE_OTHER): Payer: Self-pay | Admitting: Primary Care

## 2023-10-31 NOTE — Telephone Encounter (Signed)
I printed out the PAP for Eliquis and have in my office for the pt  I called her to discuss PAP and also make appt with APP  She did not answer- LMTCB

## 2023-10-31 NOTE — Telephone Encounter (Signed)
Pt needs a f/u with NP prior to appointment with Dr Judeth Horn. If pt is able to come by for patient assistance papers, please let me know and I will provide this. If pt is unable to stop by, we can provide this for her at her appointment with NP.

## 2023-11-01 NOTE — Telephone Encounter (Signed)
Atc x2, LMTCB

## 2023-11-01 NOTE — Telephone Encounter (Signed)
Please try and notify pt of PAP and make a f/u with APP before appointment with [place]. NFN. If needed, letter may need to be scanned into chart and mailed.

## 2023-11-02 ENCOUNTER — Ambulatory Visit (INDEPENDENT_AMBULATORY_CARE_PROVIDER_SITE_OTHER): Payer: Self-pay

## 2023-11-02 ENCOUNTER — Ambulatory Visit (INDEPENDENT_AMBULATORY_CARE_PROVIDER_SITE_OTHER): Payer: Self-pay | Admitting: Primary Care

## 2023-11-02 NOTE — Telephone Encounter (Signed)
Patient has been contacted several times by both clinical and front staff. She has not returned our attempts to contact. At this time, there are no open appointments with the Apps prior to 2/27 appt with MH. Closing encounter per office protocol.

## 2023-11-05 ENCOUNTER — Other Ambulatory Visit: Payer: Self-pay

## 2023-11-08 NOTE — Telephone Encounter (Signed)
 NFN

## 2023-11-08 NOTE — Telephone Encounter (Signed)
 Error

## 2023-11-28 ENCOUNTER — Other Ambulatory Visit: Payer: Self-pay

## 2023-11-29 ENCOUNTER — Ambulatory Visit (INDEPENDENT_AMBULATORY_CARE_PROVIDER_SITE_OTHER): Payer: Self-pay | Admitting: Pulmonary Disease

## 2023-11-29 ENCOUNTER — Encounter: Payer: Self-pay | Admitting: Pulmonary Disease

## 2023-11-29 ENCOUNTER — Other Ambulatory Visit (HOSPITAL_COMMUNITY): Payer: Self-pay

## 2023-11-29 ENCOUNTER — Other Ambulatory Visit: Payer: Self-pay

## 2023-11-29 VITALS — BP 120/84 | HR 97 | Temp 98.3°F | Ht 72.0 in | Wt 301.2 lb

## 2023-11-29 DIAGNOSIS — I2609 Other pulmonary embolism with acute cor pulmonale: Secondary | ICD-10-CM

## 2023-11-30 ENCOUNTER — Other Ambulatory Visit: Payer: Self-pay

## 2023-12-01 ENCOUNTER — Encounter: Payer: Self-pay | Admitting: Pulmonary Disease

## 2023-12-01 NOTE — Progress Notes (Signed)
 @Patient  ID: Beth [name], female    DOB: [birth year], 33 y.o.   MRN: [id number]  Chief Complaint  Patient presents with   Hospitalization Follow-up    Follow up from PE.  Needs refill on Eliquis.  Breathing is much improved.    Referring provider: No ref. provider found  HPI:   33 y.o. woman whom are seen for hospital follow-up after submassive pulmonary embolism.  Multiple hospital notes reviewed.  Overall she is doing much better.  Breathing much better.  No real dyspnea.  Much improved.  No hemoptysis melena hematochezia hematemesis.  Tolerating anticoagulation well.  We discussed in detail her echocardiogram and need for follow-up.  Discussed in detail plan for anticoagulation 3 to 6 months, longer if desired.  She initially indicates she desires no longer than this.  She does not have access to Eliquis.  She is almost out.  Only has Medicaid family-planning.  Discussed need for manufacturing assistance paperwork.   Questionaires / Pulmonary Flowsheets:   ACT:      No data to display          MMRC:     No data to display          Epworth:      No data to display          Tests:   FENO:  No results found for: "NITRICOXIDE"  PFT:     No data to display          WALK:      No data to display          Imaging: Personally reviewed No results found.  Lab Results: Personally reviewed CBC    Component Value Date/Time   WBC 8.5 09/27/2023 1833   RBC 4.44 09/27/2023 1833   HGB 12.7 09/27/2023 1833   HCT 39.7 09/27/2023 1833   PLT 268 09/27/2023 1833   MCV 89.4 09/27/2023 1833   MCH 28.6 09/27/2023 1833   MCHC 32.0 09/27/2023 1833   RDW 12.8 09/27/2023 1833   LYMPHSABS 2.3 09/27/2023 1833   MONOABS 0.6 09/27/2023 1833   EOSABS 0.1 09/27/2023 1833   BASOSABS 0.1 09/27/2023 1833    BMET    Component Value Date/Time   NA 137 09/27/2023 1833   K 3.3 (L) 09/27/2023 1833   CL 105 09/27/2023 1833   CO2 22 09/27/2023 1833    GLUCOSE 115 (H) 09/27/2023 1833   BUN 13 09/27/2023 1833   CREATININE 0.88 09/27/2023 1833   CALCIUM 9.2 09/27/2023 1833   GFRNONAA >60 09/27/2023 1833   GFRAA >60 01/24/2019 0223    BNP    Component Value Date/Time   BNP 765.7 (H) 09/04/2023 1248    ProBNP No results found for: "PROBNP"  Specialty Problems   None   No Known Allergies   There is no immunization history on file for this patient.  Past Medical History:  Diagnosis Date   Anxiety     Tobacco History: Social History   Tobacco Use  Smoking Status Never  Smokeless Tobacco Never  Tobacco Comments   History of Hookah vaping in past.   Counseling given: Not Answered Tobacco comments: History of Hookah vaping in past.   Continue to not smoke  Outpatient Encounter Medications as of 11/29/2023  Medication Sig   acetaminophen (TYLENOL) 500 MG tablet Take 1 tablet (500 mg total) by mouth every 6 (six) hours as needed.   apixaban (ELIQUIS) 5 MG TABS tablet Take 2 tablets (10 mg  total) by mouth 2 (two) times daily for 5 days, THEN 1 tablet (5 mg total) 2 (two) times daily.   escitalopram (LEXAPRO) 10 MG tablet Take 1 tablet (10 mg total) by mouth daily.   sodium chloride (OCEAN) 0.65 % SOLN nasal spray Place 1 spray into both nostrils as needed for congestion.   No facility-administered encounter medications on file as of 11/29/2023.     Review of Systems  Review of Systems  Chest pain suggestion for no orthopnea or PND.  Comprehensive review of systems otherwise negative. Physical Exam  BP 120/84 (BP Location: Left Arm, Patient Position: Sitting, Cuff Size: Large)   Pulse 97   Temp 98.3 F (36.8 C) (Oral)   Ht 6' (1.829 m)   Wt (!) 301 lb 3.2 oz (136.6 kg)   SpO2 98%   BMI 40.85 kg/m   Wt Readings from Last 5 Encounters:  11/29/23 (!) 301 lb 3.2 oz (136.6 kg)  09/27/23 (!) 304 lb (137.9 kg)  09/19/23 (!) 304 lb 9.6 oz (138.2 kg)  09/06/23 (!) 312 lb 13.3 oz (141.9 kg)  01/24/19 230 lb  (104.3 kg)    BMI Readings from Last 5 Encounters:  11/29/23 40.85 kg/m  09/27/23 41.23 kg/m  09/19/23 41.31 kg/m  09/06/23 42.43 kg/m  01/24/19 32.08 kg/m     Physical Exam General: Sitting up in chair, no acute distress Eyes: EOMI, no icterus Neck: Supple, no JVP Pulmonary: Clear Cardiovascular: Regular rate and rhythm, no edema Abdomen: Nondistended MSK: Dorsal vascular joint fusion Neuro: Normal gait, no weakness Psych: Normal mood, full affect   Assessment & Plan:   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  Continue Eliquis 5 mg BID.  Large portion of today's visit spent on the phone acquiring a small supply of Eliquis for patient from pharmacy.  Manufacturer assistance paperwork filled out today. Plan 3-6 months, consider lifelong AC but patient does not desire to do so.   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  Repeat TTE 12/2023, 33-month follow-up.  Consider workup for CTEPH if abnormalities persist.  No follow-ups on file.   Karren Burly, MD 12/01/2023   This appointment required 50 minutes of patient care (this includes precharting, chart review, review of results, face-to-face care, etc.).

## 2023-12-11 ENCOUNTER — Encounter (HOSPITAL_COMMUNITY): Payer: Self-pay | Admitting: Pulmonary Disease

## 2023-12-13 ENCOUNTER — Telehealth: Payer: Self-pay | Admitting: Pulmonary Disease

## 2023-12-13 NOTE — Telephone Encounter (Signed)
 Received fax today dated 3/13 the patient did not qualify for manufacturing assistance.  Pharmacy team, is anything we can do to get this patient Eliquis, I have called and spent a long time on the phone.  It sounds like they are not going to give her any more from the pharmacy.  I do not see how she did not qualify she was previously homeless living in a car.

## 2023-12-13 NOTE — Telephone Encounter (Signed)
 Discussed and informed patient. Will try for Xarelto assistance. Good news - has completed ~3 months of therapy which is standard of care.

## 2023-12-13 NOTE — Telephone Encounter (Signed)
 Beth [name],  Have you seen any paperwork?

## 2023-12-13 NOTE — Telephone Encounter (Signed)
 It was manufacturing assistance paperwork. She filled this out at last OV.   Margie, can you bring me paperwork if you have seen it? I filled out the form at time of her OV.

## 2023-12-13 NOTE — Telephone Encounter (Signed)
 A non eligible form was sent to Dr Judeth Horn. He has this paper now.

## 2023-12-13 NOTE — Telephone Encounter (Signed)
 Pt states at her pharm needed a PA and she filled out some ppwk here on the day of her appointment for patient assistance. We told her we would be reaching out to her about this.She has 2 days left of medication. Please call PT to advise @ [phone]    Elequis

## 2023-12-13 NOTE — Telephone Encounter (Addendum)
 Ashlyn, theres a form in the cabinet in C pod. Please give to Dr. Judeth Horn.

## 2023-12-13 NOTE — Telephone Encounter (Signed)
 I called and spoke to pt. I informed pt that I would have the PAP papers printed and ready for her anytime she wanted to stop by our office. Pt stated she would come by tomorrow morning. I will put these papers with Dr [facility] charts for tomorrow so they will have them for pt. NFN

## 2023-12-13 NOTE — Telephone Encounter (Signed)
 Beth [name] for patient.   Dr. Judeth Horn, there's a form in your paperwork that need to be signed for pt.   PA team, please advise if PA is needed for Eliquis?

## 2023-12-13 NOTE — Telephone Encounter (Signed)
 Patient should apply for Medicaid and retain denial letter to submit to patient assistance programs. Many programs are requiring this proof even for uninsured patients

## 2023-12-24 ENCOUNTER — Telehealth (HOSPITAL_COMMUNITY): Payer: Self-pay | Admitting: Pulmonary Disease

## 2023-12-24 ENCOUNTER — Encounter (HOSPITAL_COMMUNITY): Payer: Self-pay | Admitting: Pulmonary Disease

## 2023-12-24 NOTE — Telephone Encounter (Signed)
 Just an FYI. We have made several attempts to contact this patient including sending a letter to schedule or reschedule their echocardiogram. We will be removing the patient from the echo WQ.    12/24/23 MAILED LETTER AND COPIED MD  12/11/23 [facility] to schedule x 3  @ 8:40/LBW  12/06/23 [facility] to schedule x 2 @ 9:14/LBW  11/29/23 [facility] to schedule @ 3:03/LBW  SELF PAY     Thank you
